# Patient Record
Sex: Male | Born: 1950 | Race: White | Hispanic: No | Marital: Married | Smoking: Never smoker
Health system: Southern US, Community
[De-identification: ages and names within clinical notes are randomized; demographics above are authoritative.]

## PROBLEM LIST (undated history)

## (undated) DIAGNOSIS — I2510 Atherosclerotic heart disease of native coronary artery without angina pectoris: Secondary | ICD-10-CM

## (undated) DIAGNOSIS — T7840XA Allergy, unspecified, initial encounter: Secondary | ICD-10-CM

## (undated) DIAGNOSIS — R011 Cardiac murmur, unspecified: Secondary | ICD-10-CM

## (undated) DIAGNOSIS — I214 Non-ST elevation (NSTEMI) myocardial infarction: Secondary | ICD-10-CM

## (undated) DIAGNOSIS — Z789 Other specified health status: Secondary | ICD-10-CM

## (undated) DIAGNOSIS — I10 Essential (primary) hypertension: Secondary | ICD-10-CM

## (undated) DIAGNOSIS — E785 Hyperlipidemia, unspecified: Secondary | ICD-10-CM

## (undated) HISTORY — DX: Non-ST elevation (NSTEMI) myocardial infarction: I21.4

## (undated) HISTORY — DX: Allergy, unspecified, initial encounter: T78.40XA

## (undated) HISTORY — DX: Atherosclerotic heart disease of native coronary artery without angina pectoris: I25.10

## (undated) HISTORY — PX: CHOLECYSTECTOMY: SHX55

## (undated) HISTORY — DX: Cardiac murmur, unspecified: R01.1

## (undated) HISTORY — DX: Essential (primary) hypertension: I10

## (undated) HISTORY — DX: Hyperlipidemia, unspecified: E78.5

---

## 1993-01-29 HISTORY — PX: APPENDECTOMY: SHX54

## 2001-07-27 ENCOUNTER — Encounter: Payer: Self-pay | Admitting: Emergency Medicine

## 2001-07-27 ENCOUNTER — Emergency Department (HOSPITAL_COMMUNITY): Admission: EM | Admit: 2001-07-27 | Discharge: 2001-07-27 | Payer: Self-pay | Admitting: Emergency Medicine

## 2002-01-07 ENCOUNTER — Encounter: Payer: Self-pay | Admitting: Family Medicine

## 2002-01-07 ENCOUNTER — Ambulatory Visit (HOSPITAL_COMMUNITY): Admission: RE | Admit: 2002-01-07 | Discharge: 2002-01-07 | Payer: Self-pay | Admitting: Family Medicine

## 2002-02-13 ENCOUNTER — Ambulatory Visit (HOSPITAL_COMMUNITY): Admission: RE | Admit: 2002-02-13 | Discharge: 2002-02-13 | Payer: Self-pay | Admitting: Internal Medicine

## 2003-01-30 DIAGNOSIS — I214 Non-ST elevation (NSTEMI) myocardial infarction: Secondary | ICD-10-CM

## 2003-01-30 HISTORY — DX: Non-ST elevation (NSTEMI) myocardial infarction: I21.4

## 2003-07-09 ENCOUNTER — Inpatient Hospital Stay (HOSPITAL_COMMUNITY): Admission: EM | Admit: 2003-07-09 | Discharge: 2003-07-13 | Payer: Self-pay | Admitting: *Deleted

## 2003-07-09 ENCOUNTER — Encounter: Payer: Self-pay | Admitting: Emergency Medicine

## 2003-07-09 HISTORY — PX: CORONARY ANGIOPLASTY WITH STENT PLACEMENT: SHX49

## 2003-07-12 ENCOUNTER — Encounter (INDEPENDENT_AMBULATORY_CARE_PROVIDER_SITE_OTHER): Payer: Self-pay | Admitting: *Deleted

## 2003-07-23 ENCOUNTER — Observation Stay (HOSPITAL_COMMUNITY): Admission: EM | Admit: 2003-07-23 | Discharge: 2003-07-23 | Payer: Self-pay | Admitting: Cardiology

## 2003-07-23 ENCOUNTER — Encounter: Payer: Self-pay | Admitting: Emergency Medicine

## 2008-01-10 ENCOUNTER — Emergency Department (HOSPITAL_COMMUNITY): Admission: EM | Admit: 2008-01-10 | Discharge: 2008-01-11 | Payer: Self-pay | Admitting: Emergency Medicine

## 2008-03-15 HISTORY — PX: NM MYOCAR PERF WALL MOTION: HXRAD629

## 2008-04-22 ENCOUNTER — Ambulatory Visit (HOSPITAL_COMMUNITY): Admission: RE | Admit: 2008-04-22 | Discharge: 2008-04-22 | Payer: Self-pay | Admitting: General Surgery

## 2008-04-22 ENCOUNTER — Encounter (INDEPENDENT_AMBULATORY_CARE_PROVIDER_SITE_OTHER): Payer: Self-pay | Admitting: General Surgery

## 2009-08-12 IMAGING — CT CT ABDOMEN W/ CM
1 of 3 series · 14 of 32 positions shown, 19 images · IV contrast (Omnipaque 300)
Comparison: None

CT ABDOMEN

CLINICAL DATA: Abdominal pain, nausea, vomiting, past history
appendectomy

CT ABDOMEN AND PELVIS WITH CONTRAST
TECHNIQUE: Multidetector CT imaging of the abdomen and pelvis was
performed using the standard protocol following bolus
administration of intravenous contrast. Sagittal and coronal MPR
images reconstructed from axial data set.
Contrast: Dilute oral contrast and 100 ml Wmnipaque-722

[Series 2: abd_pel 5.0 b40f · axial · 0.82mm/px · z∈[-660,-230]mm · 14 of 98 slices shown, 19 images]
[im 6/98  soft-tissue]
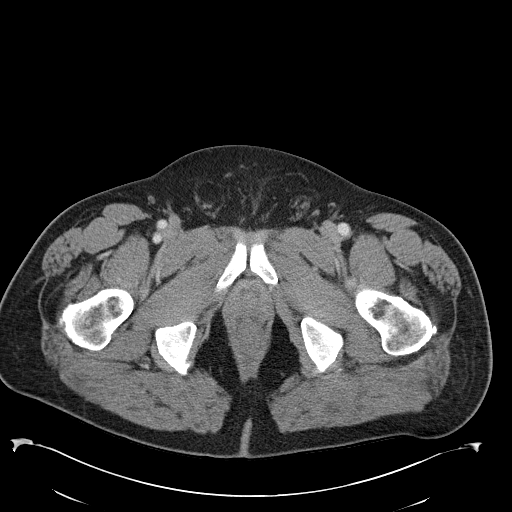
[im 6/98  bone]
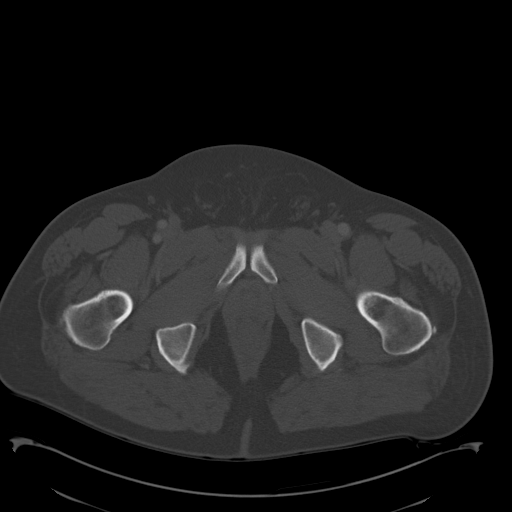
[im 12/98  soft-tissue]
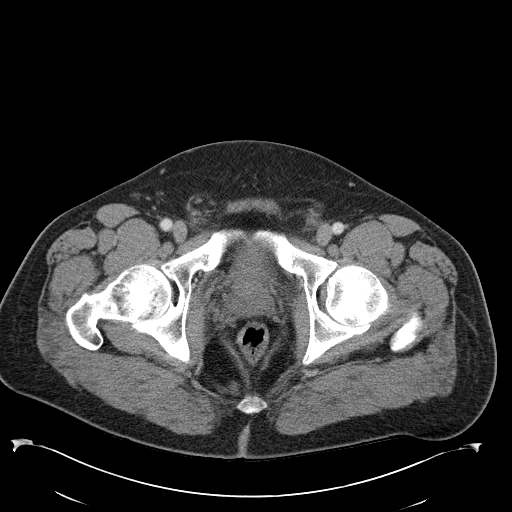
[im 23/98  soft-tissue]
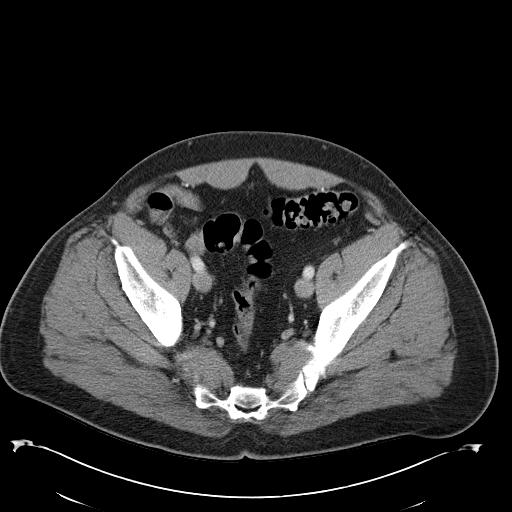
[im 29/98  soft-tissue]
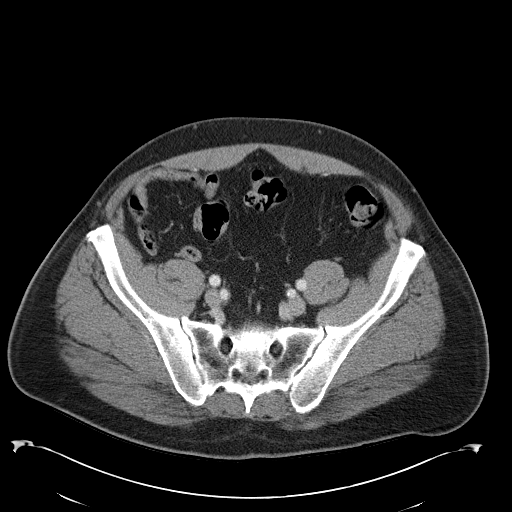
[im 35/98  soft-tissue]
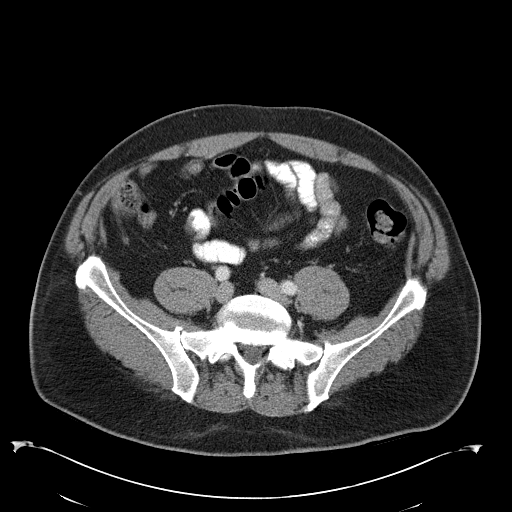
[im 40/98  soft-tissue]
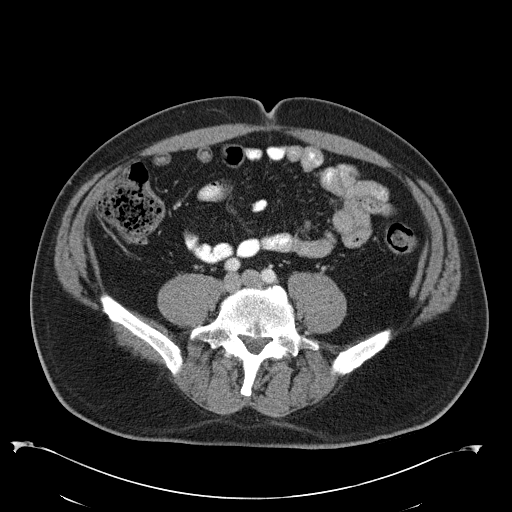
[im 52/98  soft-tissue]
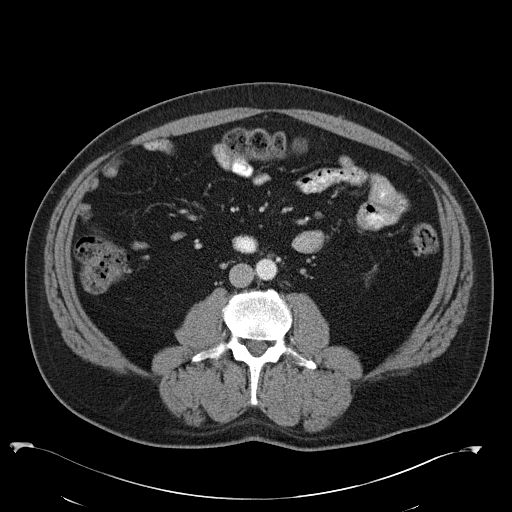
[im 58/98  soft-tissue]
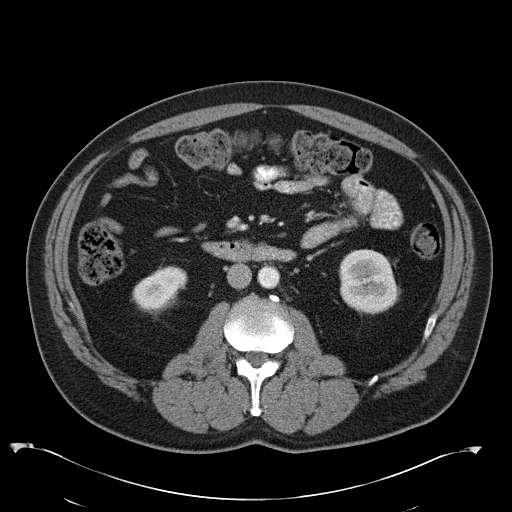
[im 63/98  soft-tissue]
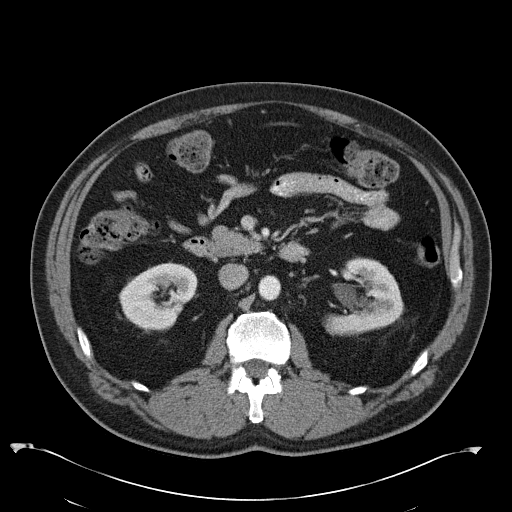
[im 63/98  bone]
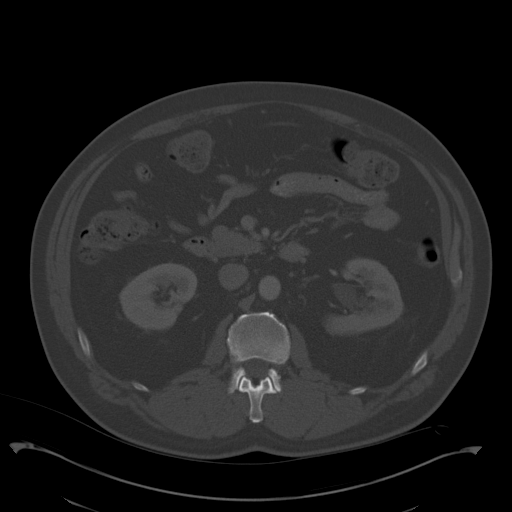
[im 69/98  soft-tissue]
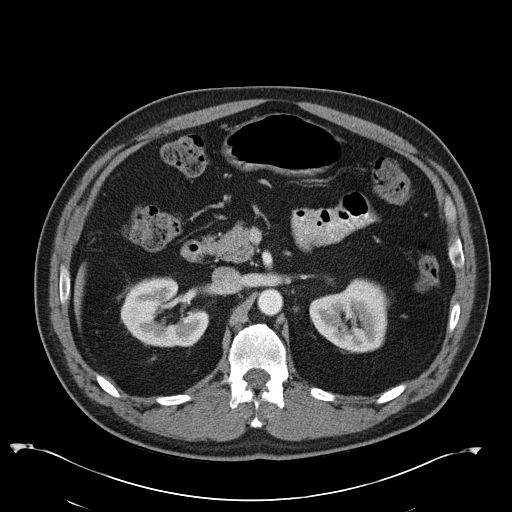
[im 75/98  soft-tissue]
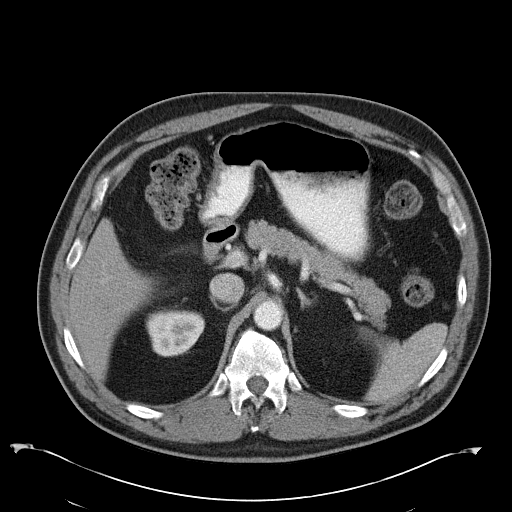
[im 75/98  lung]
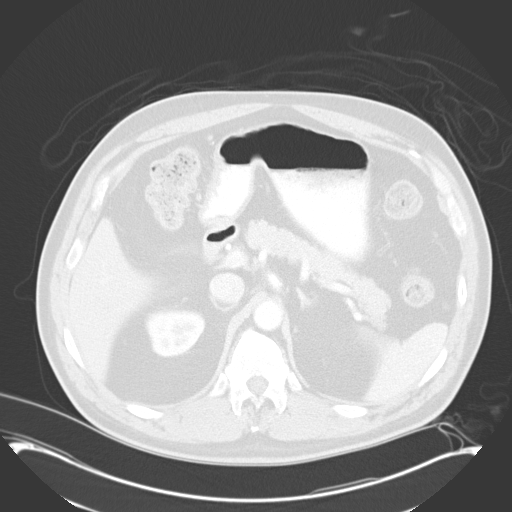
[im 80/98  lung]
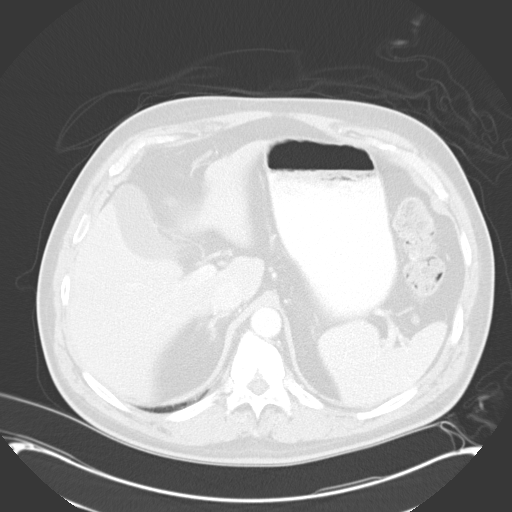
[im 86/98  soft-tissue]
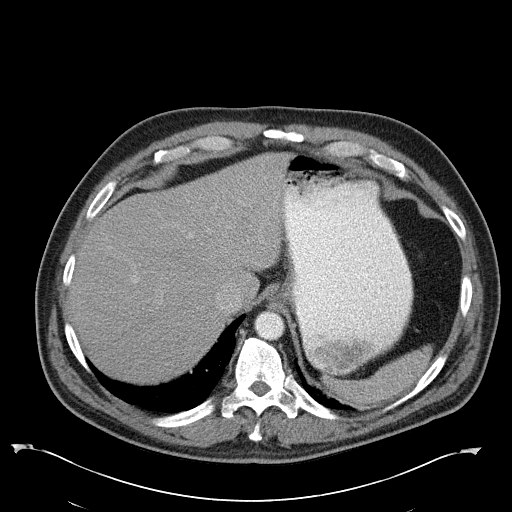
[im 86/98  lung]
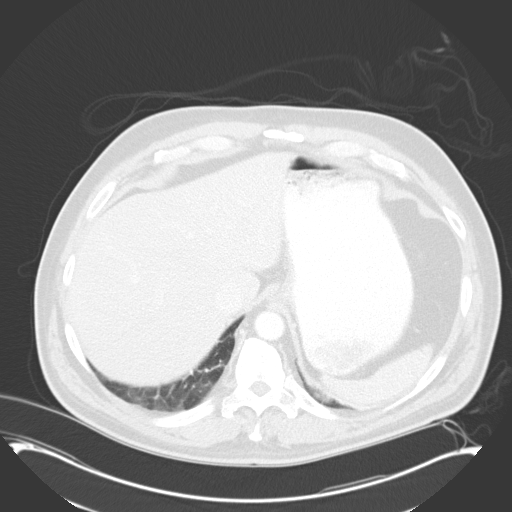
[im 92/98  soft-tissue]
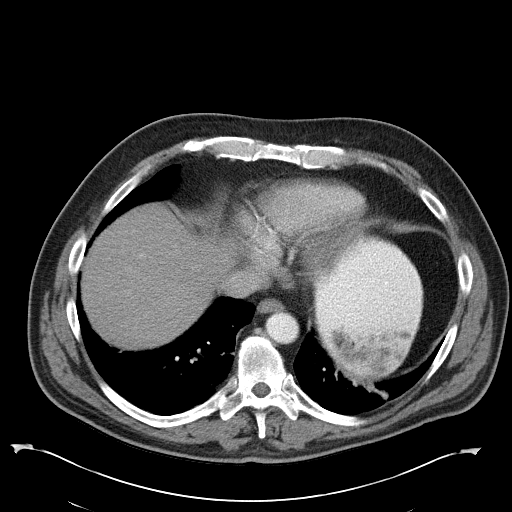
[im 92/98  lung]
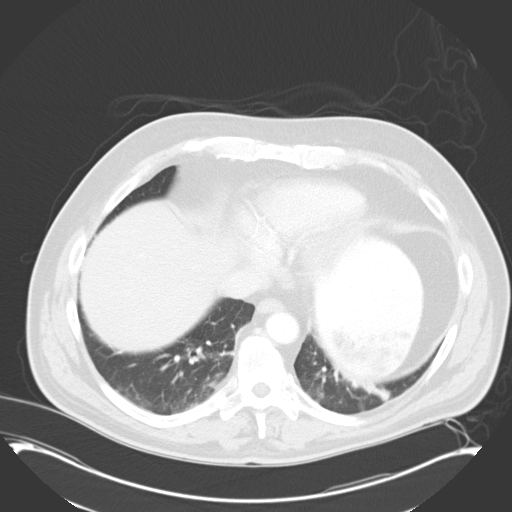

[14 of 32 positions shown; findings below may reference images not displayed]

FINDINGS: Subsegmental atelectasis bilateral lower lobes, greater on left.
Calcified gallstones at neck of gallbladder without gross
gallbladder wall thickening.
 However, on sagittal and coronal images, question minimal
pericholecystic infiltrative changes, cannot exclude early acute
cholecystitis.
Small peripelvic cyst left kidney.
Remainder of liver, spleen, pancreas, kidneys, and adrenal glands
normal.
Scattered colonic diverticulosis without evidence of diverticulitis
in upper abdomen.
Stomach and small bowel loops in upper abdomen normal.
No mass, adenopathy, or free fluid.
IMPRESSION: Scattered colonic diverticulosis.
Cholelithiasis, with questionable minimal pericholecystic
infiltrative changes, cannot exclude early acute cholecystitis.

CT PELVIS
FINDINGS: Diffuse simple/uncomplicated sigmoid diverticula noted.
Large and small bowel loops in pelvis otherwise normal.
No features of acute diverticulitis identified.
Minimal enlargement of right inguinal canal, question inguinal
hernia containing fat.
No pelvic mass, adenopathy, free fluid, or inflammatory process.
Bladder, ureters, and prostate gland grossly normal.
Spur formation vertebral endplates thoracolumbar spine.
IMPRESSION: No acute intrapelvic abnormalities.
Minimal sigmoid diverticulosis.

## 2010-05-11 LAB — BASIC METABOLIC PANEL
BUN: 10 mg/dL (ref 6–23)
CO2: 29 mEq/L (ref 19–32)
Chloride: 103 mEq/L (ref 96–112)
Creatinine, Ser: 0.92 mg/dL (ref 0.4–1.5)
Glucose, Bld: 112 mg/dL — ABNORMAL HIGH (ref 70–99)

## 2010-05-11 LAB — HEPATIC FUNCTION PANEL
Indirect Bilirubin: 0.4 mg/dL (ref 0.3–0.9)
Total Protein: 6.2 g/dL (ref 6.0–8.3)

## 2010-05-11 LAB — CBC
MCHC: 35 g/dL (ref 30.0–36.0)
MCV: 88.9 fL (ref 78.0–100.0)
Platelets: 240 10*3/uL (ref 150–400)
RDW: 12.4 % (ref 11.5–15.5)

## 2010-06-13 NOTE — H&P (Signed)
NAME:  Christopher Obrien, PISCITELLO                  ACCOUNT NO.:  [id number]   MEDICAL RECORD NO.:  [id number]          PATIENT TYPE:  AMB   LOCATION:  DAY                           FACILITY:  [facility]   PHYSICIAN:  Dalia Heading, M.D.  DATE OF BIRTH:  16-Jul-1950   DATE OF ADMISSION:  DATE OF DISCHARGE:  LH                              HISTORY & PHYSICAL   CHIEF COMPLAINT:  Cholecystitis, cholelithiasis.   HISTORY OF PRESENT ILLNESS:  The patient is a 60 year old white male who  is referred for evaluation and treatment for biliary colic secondary to  cholelithiasis.  He has been having intermittent right upper quadrant  abdominal pain with radiation to the flank, nausea and indigestion for  the past few months.  This is made worse with fatty fluids.  No fever,  chills, or jaundice have been noted.   PAST MEDICAL HISTORY:  1. Coronary artery disease.  2. Hypertension.   PAST SURGICAL HISTORY:  Appendectomy in the remote past.   MEDICATIONS:  1. Lisinopril 5 mg p.o. daily.  2. Toprol 50 mg p.o. daily.  3. Simvastatin 20 mg p.o. daily.  4. Plavix which he is holding.  5. Aspirin which he is holding.   ALLERGIES:  PENICILLIN.   REVIEW OF SYSTEMS:  The patient had a recent stress test in February  2010, which was unremarkable.  The patient denies drinking or smoking.   PHYSICAL EXAMINATION:  GENERAL:  The patient is a well-developed, well-  nourished white male in no acute distress.  HEENT:  No scleral icterus.  LUNGS:  Clear to auscultation with equal breath sounds bilaterally.  HEART:  Regular rate and rhythm without S3, S4, or murmurs.  ABDOMEN:  Soft and nondistended.  He is tender in the right upper  quadrant to palpation.  No hepatosplenomegaly, masses, or hernias are  identified.   CT scan of the abdomen and pelvis reveals cholelithiasis with a normal  common bile duct.   IMPRESSION:  1. Cholecystitis.  2. Cholelithiasis.   PLAN:  The patient is scheduled for laparoscopic  cholecystectomy on  April 22, 2008.  The risks and benefits of the procedure including  bleeding, infection, hepatobiliary injury, and the possibility of an  open procedure were fully explained to the patient, gave informed  consent.      Dalia Heading, M.D.  Electronically Signed     MAJ/MEDQ  D:  04/15/2008  T:  04/16/2008  Job:  [id number]   cc:   Corrie Mckusick, M.D.  Fax: [phone]

## 2010-06-13 NOTE — Op Note (Signed)
NAME:  Christopher Obrien, Christopher Obrien                  ACCOUNT NO.:  [id number]   MEDICAL RECORD NO.:  [id number]          PATIENT TYPE:  AMB   LOCATION:  DAY                           FACILITY:  [facility]   PHYSICIAN:  Dalia Heading, M.D.  DATE OF BIRTH:  08/26/1950   DATE OF PROCEDURE:  04/22/2008  DATE OF DISCHARGE:                               OPERATIVE REPORT   PREOPERATIVE DIAGNOSES:  Cholecystitis, cholelithiasis.   POSTOPERATIVE DIAGNOSES:  Cholecystitis, cholelithiasis.   PROCEDURE:  Laparoscopic cholecystectomy.   SURGEON:  Dalia Heading, M.D.   ANESTHESIA:  General endotracheal.   INDICATIONS:  The patient is a 60 year old white male who presents with  a biliary colic secondary to cholelithiasis.  The risks and benefits of  the procedure including bleeding, infection, hepatobiliary injury, and  the possibility of an open procedure were fully explained to the  patient, gave informed consent.   PROCEDURE NOTE:  The patient was placed in supine position.  After  induction of general endotracheal anesthesia, the abdomen was prepped  and draped using the usual sterile technique with Betadine.  Surgical  site confirmation was performed.   A supraumbilical incision was made down to the fascia.  A Veress needle  was introduced into the abdominal cavity and confirmation of placement  was done using the saline drop test.  The abdomen was then insufflated  to 16 mmHg pressure.  An 11-mm trocar was introduced into the abdominal  cavity under direct visualization without difficulty.  The patient was  placed in reversed Trendelenburg position.  An additional 11-mm trocar  was placed in the epigastric region.  The 5-mm trocars were placed in  the right upper quadrant, right flank regions.  The liver was inspected  and noted to be within normal limits.  The gallbladder was retracted  superiorly and laterally.  The dissection was begun around the  infundibulum of the gallbladder.  The cystic duct was  first identified.  The juncture to the infundibulum was fully identified.  Endoclips were  placed proximally and distally on the cystic duct, and the cystic duct  was divided.  This likewise done in the cystic artery.  The gallbladder  was then freed away from the gallbladder fossa using Bovie  electrocautery.  There was some spillage of bile.  The gallbladder was  delivered through the epigastric trocar site using EndoCatch bag.  The  gallbladder fossa was inspected and no abnormal bleeding or bile leakage  was noted.  Surgicel was placed in the gallbladder fossa.  All fluid and  air were then evacuated from the abdominal cavity prior to the removal  of the trocars.   All wounds were irrigated with normal saline.  The supraumbilical fascia  as well as epigastric fascia reapproximated using an 0 Vicryl  interrupted suture.  All skin incisions were closed using staples.  Betadine ointment and dry sterile dressings were applied.   All tape and needle counts were correct at the end of the procedure.  The patient was extubated in the operating room, went back to recovery  room, awake and in  stable condition.   COMPLICATIONS:  None.   SPECIMEN:  Gallbladder.   BLOOD LOSS:  Minimal.      Dalia Heading, M.D.  Electronically Signed     MAJ/MEDQ  D:  04/22/2008  T:  04/22/2008  Job:  [id number]   cc:   Corrie Mckusick, M.D.  Fax: [phone]

## 2010-06-16 NOTE — Discharge Summary (Signed)
NAME:  Christopher Obrien, Christopher Obrien                            ACCOUNT NO.:  [id number]   MEDICAL RECORD NO.:  [id number]                   PATIENT TYPE:  INP   LOCATION:  6523                                 FACILITY:  [facility]   PHYSICIAN:  Dani Gobble, MD                    DATE OF BIRTH:  October 02, 1950   DATE OF ADMISSION:  07/09/2003  DATE OF DISCHARGE:  07/13/2003                                 DISCHARGE SUMMARY   ADMISSION DIAGNOSES:  1. Acute myocardial infarction.  2. No significant past medical history including no prior cardiac history     and no known cardiac risk factors.   DISCHARGE DIAGNOSES:  1. Acute myocardial infarction.  2. No significant past medical history including no prior cardiac history     and no known cardiac risk factors.  3. Status post cardiac catheterization on July 12, 2003, by Dr. Gaspar Garbe B.     Little.  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  He received a PTCA stent using mini-Vision stents to     the area with good results, from 100% occlusion to less than 10% residual     stenosis.  See his dictated report for other details and findings.  He     used IV heparin and IV Integrilin and used Plavix 300 mg p.o. in the     case.  4. Hyperlipidemia.   HISTORY OF PRESENT ILLNESS:  Christopher Obrien is a 60 year old white male with no  significant past medical history and no history of coronary artery disease.  He presented to [facility] on July 09, 2003, reporting that he did some  heavy lifting earlier in the day to take the trash to the dump.  At that  time, he expressed some vague abdominal discomfort with radiation to the  bilateral chest for about five minutes, and then it resolved.  It then  recurred with radiation to the left arm and the left posterior neck and jaw.  There was a waxing and waning pattern for about 75 minutes, and then he went  to the [facility] ER.  He obtained relief after four aspirin and three  sublingual  nitroglycerin.  He was placed on IV heparin, IV nitroglycerin and  was on these medications when he was transferred to [facility].  At the time  of our evaluation, his abdominal discomfort and chest pain have been  relieved.  He did still have a odd sensation in the left neck, but no real  pain.   PAST MEDICAL HISTORY:  He reported he had prior known coronary artery  disease, and he had no prior similar episodes.  He had no known  hypertension, diabetes, tobacco use or hyperlipidemia.  He has had no recent  palpitations, shortness of breath, dizziness, presyncope, edema, PND or  orthopnea.   FAMILY HISTORY:  He was adopted and did not known his family history.   PHYSICAL EXAMINATION:  VITAL SIGNS:  On exam at this time, blood pressure  106/65, heart rate 74.  He did have a 1-2/6 systolic murmur on cardiac exam.  No other significant abnormalities on exam.   At this point, EKG reviewed from [facility] had shown normal sinus rhythm,  suggestion of inferior myocardial infarction, age indeterminate, with 1 mm  of ST elevation in 3 and aVF and late transition.  Repeat EKG showed ST  elevation, resolved, and more subtle ST changes at this time.  Otherwise, no  other change as compared to prior tracing.   LABORATORY DATA:  Lab at this time showed cardiac markers, myoglobin's were  81.6, 265, 353.  CK-MB was less than 1.  Then it was 3.6 and 4.7.  Troponin  was less than 0.05, 0.07, 0.07.   At this point, he was seen and evaluated by Dr. Sherral Hammers.  She  planned for admission to the telemetry unit to check serial enzymes and  follow up probable MI.  She will continue IV heparin and IV nitroglycerin.  Would consider 2B3A in anticipation of possible catheterization and PCI.  At  that point, she was waiting for the followup EKG.  The EKG then was done and  showed suggested inferior MI, age indeterminate, but no further suggestion  of ST elevation.  However, the patient did report some  recurrent chest  tightness at 2/10, so she went ahead and added 2B3A.  She continued aspirin,  beta-blocker.  No ACE inhibitor was given at that time because of low blood  pressure in anticipation of contrast dye.  We would add empiric statin and  check fasting lipid profile.  He would need a cardiac catheterization but  the timing would depend on clinical course.   HOSPITAL COURSE:  On the morning of July 10, 2003, Christopher Obrien still had some  tightness in the left upper chest.  He was hemodynamically stable with blood  pressure 113/72, heart rate 56.  Labs that morning showed CK of 399 and MB  84, troponin of 1.82.  This was the only set of cardiac enzymes after the  cardiac markers that were noted above.  At this point, he was continued on  heparin, nitroglycerin, 2B3A and other medications as above.  At this point,  he was evaluated by Dr. Clarene Duke, and we planned to proceed with  catheterization on Monday morning.  We would do it sooner if he had further  more severe chest pain, or EKG changes.   On July 11, 2003, he was feeling better.  At this point, we had a full set  of cardiac enzymes, and they had peaked with a CK of 802, MB 182.1 and  troponin of 15.44.  However, at this point, the CK-MB's were trending  downward after that peak.  Again, he was seen by Dr. Clarene Duke once again.  He  noticed that he enzymes had peaked.  His EKG still was essentially normal  with no ST-T changes.  At this point, he was pain-free.  He planned to  proceed with catheterization on the following morning, Monday morning.   On July 12, 2003, he underwent cardiac catheterization by Dr. Clarene Duke.  See  his dictated report for all findings.  The notable finding was 100%  occlusion in the proximal vessel of the OM1.  Dr. Clarene Duke then proceeded with  PTCA stent using mini-Vision stent to the area.  It was 100% occluded at the beginning of the case, and was less than 10% occluded post intervention.  During the case,  he used Integrilin, IV heparin, and Plavix 300 mg p.o.  The  patient tolerated the procedure well without complications.  See the  dictated report for further findings.   DISCHARGE PHYSICAL EXAMINATION:  On the morning of July 13, 2003, Mr. Grumbine  is doing well.  At this point, his heart rate is 80's, blood pressure 120-  130/60.  He is having no chest pain or shortness of breath.  He feels good.  The catheterization site has no bruising, bleeding or hematoma.  The lungs  are clear.  Heart is a regular rhythm with no notable murmur at this time.  Creatinine is stable at 1.1, and enzymes continued to peak downward.  At  this point, he was seen an evaluated by Dr. Caprice Kluver who deems him stable  for discharge home.  His lipid profile did come back during the  hospitalization with an LDL of 107.  Medications include Zocor 20 mg,  aspirin, Plavix, ACE inhibitor, and beta-blocker therapy.   HOSPITAL CONSULTATIONS:  None.   HOSPITAL PROCEDURES:  Cardiac catheterization performed on July 12, 2003, by  Dr. Caprice Kluver.  See the dictated report for details.  Notable findings  included a 100% occlusion of a branch of the OM1 vessel.  He proceeded with  PTCA stent using a Mini-Vision stent.  At the beginning of the case, there  was 100% occlusion.  Post-intervention, it was less than 10% residual  stenosis.  The patient tolerated the procedure well without complications.  During the procedure, he used Integrilin, IV heparin and Plavix 300 mg p.o.  See his dictated report for other findings.  As well, the patient did have  normal LV function, and had no complications.   LABORATORY DATA:  TSH normal at 0.543.  CRP is 0.8.  Lipid profile shows  total cholesterol 159, triglycerides  86, HDL 38, LDL 104.  Cardiac enzymes  showed CK's of 399, 802, 490, 113.  CK-MB 84.0, 182,1, 80.1, 11.3.  Troponin  is 1.82, 14.56, 15.44, 12.56.  Hemoglobin A1c is 5.0.  On admission, sodium  138, potassium 3.8, glucose  120, BUN 7, creatinine 1.0.  These are all  stable at the time of discharge home including a creatinine of 1.1.  Homocystine is 9.73.  On admission, white count 13.1, hemoglobin 13.7,  hematocrit 39.1, platelets 260,000.  These are all stable at discharge  including hemoglobin of 14.1, hematocrit 40.7, and platelets 272,000 post  heparin.   EKG on July 09, 2003, showed sinus bradycardia at 55 beats per minute.  There is some nonspecific indeterminate Q changes in lead aVF, no  significant ST-T change.  Same is true for EKG on July 10, 2003.   A 2-D echocardiogram on July 16, 2003, shows a technically difficult study.  LV size appears normal.  Overall, EF appears preserved.  No significant  valvular abnormality noted.   DISCHARGE MEDICATIONS:  1. Plavix 75 mg once a day.  2. Aspirin 81 mg once a day.  3. Zocor 20 mg each night.  4. Zestril 5 mg once a day.  5. __________50 mg once a day. 6. Nitroglycerin 0.4 mg sublingual as directed.   DISCHARGE INSTRUCTIONS:  1. No strenuous activity until you see Dr. Dani Gobble.  2. No lifting greater than five pounds, driving or sexual activities for     three days.  3. Low cholesterol diet.  4. May gently wash your groin site.  5. Call [phone] for any bleeding increase or pain at the groin site.  6. Follow up with Dr. Domingo Sep in the [facility] office June 28, at 11     o'clock a.m.      Mary B. Easley, P.A.-C.                   Dani Gobble, MD    MBE/MEDQ  D:  08/05/2003  T:  08/05/2003  Job:  [id number]

## 2010-06-16 NOTE — Cardiovascular Report (Signed)
NAME:  Christopher Obrien, Christopher Obrien                            ACCOUNT NO.:  [id number]   MEDICAL RECORD NO.:  [id number]                   PATIENT TYPE:  INP   LOCATION:  6523                                 FACILITY:  [facility]   PHYSICIAN:  Thereasa Solo. Little, M.D.              DATE OF BIRTH:  Nov 21, 1950   DATE OF PROCEDURE:  07/12/2003  DATE OF DISCHARGE:                              CARDIAC CATHETERIZATION   PROCEDURE:  Cardiac catheterization.   CARDIOLOGIST:  Thereasa Solo. Little, M.D.   INDICATIONS:  Christopher Obrien is a 60 year old male who was admitted on July 09, 2003, with chest pain.  He had a normal EKG and has maintained a normal EKG.  He had low positive cardiac enzymes the morning after his admission but  subsequent enzymes 24 hours later peaked with a troponin of greater than 15  and a CK-MB of 182.  Despite these marked elevations, he was pain-free and  had a normal electrocardiogram.   He is brought to the cardiac catheterization lab for evaluation of what  appears to be non-Q wave myocardial infarction.   DESCRIPTION OF PROCEDURE:  After obtaining informed consent, the patient was  prepped and draped in the usual sterile fashion exposing the right groin.  Following local anesthetic with 1% Xylocaine, the Seldinger technique was  employed, and a 6 [place] sheath introducer sheath was placed in the right  femoral artery.  Right and left coronary arteriography and ventriculography  in the RAO and LAO projection was performed.  Following this, percutaneous  coronary intervention to the OM system was performed.   COMPLICATIONS:  None.   EQUIPMENT:  Diagnostic 6 French Judkins configuration catheters.   RESULTS:  HEMODYNAMIC MONITORING:  Central aortic pressure 118/74.  Left  ventricular pressure 118/14 with no significant valve gradient at the time  of pullback.  VENTRICULOGRAPHY:  Ventriculography in both the RAO and LAO projections  revealed normal LV systolic function.  In particular,  there were no wall  motion abnormalities involving the lateral wall.  His ejection fraction was  in excess of 60%.  The left ventricular end-diastolic pressure was 24.  CORONARY ARTERIOGRAM:  1. Left main normal.  2. Left anterior descending:  The LAD was a large vessel that extended down     to the apex of the heart.  After the takeoff of the first diagonal, the     vessel became about 2.5 mm from about 3.5 mm.  There was a 30% narrowing     in the LAD after the takeoff of the diagonal.  The first diagonal was a     very large vessel greater than 3.5 mm and was free of disease.  The     proximal LAD had no significant abnormalities.  3. Circumflex:  The circumflex was a large vessel giving off two large OM     vessels.  They were about 3.5 mm  in diameter.  OM-I bifurcated and gave     off a branch that was totally occluded.  You can see just enough of the     vessel and some late filling of the distal vessel via left-to-left     collaterals.  There was contrast staining of the proximal portion raising     a concern of a small thrombus.  The remainder of the OM-I was free of     disease.  The OM-II was a large vessel that was free of disease and the     ongoing circumflex after the OM-2 was small but supplied the posterior     lateral wall.  4. Right coronary artery:  The right coronary artery was a large vessel     giving rise to a PDA.   CONCLUSION:  1. Normal left ventricular systolic function in both the RAO and LAO     projections.  2. Total occlusion of a branch off of the first obtuse marginal vessel.  3. Because of the collateral filling of the distal portion of this vessel,     arrangements were made for percutaneous intervention.  A 6 [place]     guide catheter was used and a short luge wire.  The wire was placed down     through the total obstruction into what appeared to be the distal portion     of the vessel.  The 2.0 x 9 Maverick balloon was made ready and placed in      the proximal portion of the vessel.  Care was taken so that the inflation     never involved the ostium of this vessel that would put the large OM at     jeopardy.  Three inflations were performed ranging from 7 atmospheres to     8 atmospheres for around 55 seconds.  Despite this, there was still a 60%     area or narrowing but the distal vessel appeared to be quite large in     length and relatively large in diameter.   A 2.0 x 12 Minivision stent was then placed just past the ostium of this  vessel and extending well past an area that was initially totally occluded  and now 60% narrowed.  It was initially deployed at 7 atmospheres for 70  seconds with a final inflation being 9 atmospheres for 60 seconds.  Following intervention, there appeared to be less than 10% narrowing.  The  distal vessel did not show any evidence of dissection or thrombus formation.  The patient was given a total of 6200 units of IV heparin.  He has been on  Integrilin since Friday, and this will be continued for an additional 12  hours.  He was also given 300 mg of oral Plavix, and he should be ready for  discharge in the morning.                                               Thereasa Solo. Little, M.D.    ABL/MEDQ  D:  07/12/2003  T:  07/13/2003  Job:  [id number]   cc:   Catheterization Lab   Patrica Duel, M.D.  [address], [address]  [place]  [place] [address]  Fax: [phone]   Dani Gobble, MD  Fax: [phone]

## 2010-06-16 NOTE — Cardiovascular Report (Signed)
NAME:  Christopher Obrien, Christopher Obrien                            ACCOUNT NO.:  [id number]   MEDICAL RECORD NO.:  [id number]                   PATIENT TYPE:  INP   LOCATION:  4741                                 FACILITY:  [facility]   PHYSICIAN:  Nicki Guadalajara, M.D.                  DATE OF BIRTH:  [birth year]   DATE OF PROCEDURE:  07/23/2003  DATE OF DISCHARGE:  07/23/2003                              CARDIAC CATHETERIZATION   PROCEDURE:  Cardiac catheterization.   CARDIOLOGIST:  Nicki Guadalajara, M.D.   INDICATIONS:  Christopher Obrien is a 60 year old gentleman who suffered a non-Q  myocardial infarction on July 09, 2003, and underwent PTCA stenting of an  occluded inferior branch of an OM-1 vessel.  This was done by Dr. Clarene Duke and  he had a 2-0 x 12 mm Minivision stent inserted.  He had done well.  This  morning, he developed recurrent episodes of left jaw discomfort.  He was  concerned perhaps that this may be similar to the previous jaw pain with  mild associated chest pain which he experienced at the time of his MI.  He  took nit.  He ultimately presented to [facility] Emergency Room where he was  treated with IV heparin.  He was transferred to [facility] for more  definitive evaluation.  With the non drug-eluting stent inserted and small  vessel size, the decision was made to perform definitive repeat cardiac  catheterization to make certain there was no suggestion of potential early  restenosis or additional abnormalities.   DESCRIPTION OF PROCEDURE:  After premedication with Valium 5 mg  intravenously, the patient was prepped and draped in the usual fashion. His  right femoral artery was punctured anteriorly, and a 5 French sheath was  inserted.  Diagnostic catheterization was done with Judkins 4 left and right  coronary catheters.  A 5 French pigtail catheter was used for biplane cine  left ventriculography.  The patient tolerated the procedure well.  Hemostasis was obtained by direct manual  pressure.   HEMODYNAMIC DATA:  Central aortic pressure was 116/70.  Left ventricular  pressure 116/11.   ANGIOGRAPHIC DATA:  The left main coronary artery was angiographically  normal and bifurcated into an LAD and left circumflex system.   The LAD had mild luminal irregularity of 10-20% in its proximal segment  before the first diagonal septal perforating artery.  The remainder of the  LAD was free of significant disease.   The circumflex vessel was a moderate size vessel.  The first OM vessel was a  small caliber.  The major OM vessel was immediately bifurcated.  A stent was  seen in the proximal portion of this marginal inferior segment.  The stent  was widely patent.  There was no evidence for in-stent restenosis.  There  was minimal 10-20% narrowing proximal to the stented segment just after  arising from the  main portion of the OM vessel.  The AV groove, circumflex  and its branches were angiographically normal.   The right coronary artery gave rise to a PDA.  There was mild luminal  irregularity of 20% in the proximal to mid segment.   Biplane cine left ventriculography revealed preserved global contractility  with ejection fraction of least 55-60%.  There was a very small focal zone  of mild mid inferior hypocontractility and mid posterior lateral  hypocontractility seen on the RAO and LAO images.   IMPRESSION:  1. Normal global left ventricular contractility with minimal residual mid     diaphragmatic and low posterior lateral hypocontractility.  2. No evidence of any restenosis at the previously placed stent in the     obtuse marginal vessel of the circumflex coronary artery with mild 10%     narrowing proximal to the stented segment.  3. Luminal irregularities of the proximal left anterior descending.  4. Luminal irregularity of 10-20% of the right coronary artery.   RECOMMENDATIONS:  Medical therapy.                                               Nicki Guadalajara,  M.D.    TK/MEDQ  D:  07/23/2003  T:  07/24/2003  Job:  [id number]   cc:   Nicki Guadalajara, M.D.  [phone] [address], [address]  [place], [place] [address]  Fax: [phone]   Patrica Duel, M.D.  [address], [address]  [place]  [place] [address]  Fax: [phone]   Dani Gobble, MD  Fax: [phone]

## 2010-06-16 NOTE — Procedures (Signed)
[facility]  Patient:    Christopher Obrien, Christopher Obrien Visit Number: [id number] MRN: [id number]          Service Type: EMS Location: ED Attending Physician:  Hilario Quarry Dictated by:   Kari Baars, M.D. Proc. Date: 07/27/01 Admit Date:  07/27/2001 Discharge Date: 07/27/2001                            EKG Interpretations  The rhythm is sinus rhythm with a rate in the 70s.  Normal electrocardiogram. Dictated by:   Kari Baars, M.D. Attending Physician:  Hilario Quarry DD:  07/27/01 TD:  07/28/01 Job: [id number]

## 2010-06-16 NOTE — Op Note (Signed)
   NAME:  Christopher Obrien, Christopher Obrien                            ACCOUNT NO.:  [id number]   MEDICAL RECORD NO.:  [id number]                   PATIENT TYPE:  AMB   LOCATION:  DAY                                  FACILITY:  [facility]   PHYSICIAN:  Lionel December, M.D.                 DATE OF BIRTH:  Aug 11, 1950   DATE OF PROCEDURE:  02/14/2002  DATE OF DISCHARGE:                                 OPERATIVE REPORT   PROCEDURE:  Total colonoscopy.   INDICATIONS:  The patient is a 60 year old Caucasian male who is here for  screening colonoscopy.  He does not have any GI symptoms.  Family history is  unknown.   The procedure was reviewed with the patient and informed consent was  obtained.   PREMEDICATION:  Demerol 50 mg IV, Versed 4 mg IV in divided dose.   INSTRUMENT USED:  Olympus video system.   FINDINGS:  Procedure performed in endoscopy suite.  The patient's vital  signs and O2 saturation were monitored during the procedure and remained  stable.  The patient was placed in the left lateral recumbent position and  rectal examination performed.  No abnormality noted on external or digital  exam.  The scope was placed in the rectum and advanced under vision in the  sigmoid colon and beyond.  Preparation was excellent.  The scope was passed  into the cecum, which was identified by the appendiceal stump and ileocecal  valve.  Pictures were taken for the record.  As the scope was withdrawn,  colonic mucosa was once again carefully examined.  There were two small  diverticula at the transverse colon and a few more small ones at the sigmoid  colon.  There were no polyps or tumor masses.  The rectal mucosa was normal.  The scope was retroflexed to examine the anorectal junction.  Small  hemorrhoids were noted below the dentate line.  The endoscope was  straightened and withdrawn.  The patient tolerated the procedure well.   FINAL DIAGNOSES:  Few small scattered diverticula at sigmoid and transverse  colon and  external hemorrhoids, otherwise normal colonoscopy.    RECOMMENDATIONS:  1. A high-fiber diet.  2. Yearly Hemoccults.  3. He may consider next screening exam in 10 years from now.                                               Lionel December, M.D.    NR/MEDQ  D:  02/13/2002  T:  02/14/2002  Job:  [id number]   cc:   Patrica Duel, M.D.  [address], [address]  [place]  [place] [address]  Fax: [phone]

## 2010-06-16 NOTE — Discharge Summary (Signed)
NAME:  Christopher Obrien, Christopher Obrien                            ACCOUNT NO.:  [id number]   MEDICAL RECORD NO.:  [id number]                   PATIENT TYPE:  INP   LOCATION:  4741                                 FACILITY:  [facility]   PHYSICIAN:  Nicki Guadalajara, M.D.                  DATE OF BIRTH:  1950/09/30   DATE OF ADMISSION:  07/23/2003  DATE OF DISCHARGE:  07/23/2003                                 DISCHARGE SUMMARY   ADMISSION DIAGNOSES:  1. Unstable angina.  2. Coronary artery disease with prior stent to the obtuse marginal July 11, 2003.  3. Hyperlipidemia.   DISCHARGE DIAGNOSES:  1. Unstable angina.  2. Coronary artery disease with prior stent to the obtuse marginal July 11, 2003.  3. Hyperlipidemia.   PROCEDURES:  Cardiac catheterization, June 24, by Nicki Guadalajara, M.D.   BRIEF HISTORY:  The patient is a 60 year old white male with a recent  admission on July 09, 2003, for a subendocardial myocardial infarction with  a stent to a branch of the OM on July 11, 2003.  He did well until 3 a.m.  the day of admission.  He woke up to go to the bathroom and had left face  and jaw and tongue discomfort that would not go away, so he took a  nitroglycerin.  The discomfort resolved over 10 minutes.  The patient  worried, so he came to the ER at [facility] and was transferred to  [facility].  The pain was similar to what he had when he had his  MI.  He was subsequently admitted and seen by Dr. Daphene Jaeger, and it was his  opinion that he should undergo cardiac catheterization.   PAST MEDICAL HISTORY:  Cardiac disease with SEMI July 09, 2003.  He has no  history of hypertension, diabetes, or tobacco use.   ALLERGIES:  PENICILLIN causes a rash.   CURRENT MEDICATIONS:  1. Plavix 75 mg daily.  2. Aspirin 81 mg daily.  3. Zocor 20 mg h.s.  4. Zestril 5 mg daily.  5. Toprol XL 50 mg daily.  6. Sublingual nitroglycerin.   FAMILY HISTORY:  Unknown.  The patient is adopted.   SOCIAL HISTORY:  He is married, three children.  Does not use tobacco or  alcohol, and no drug use.   REVIEW OF SYSTEMS:  Negative except for cardiovascular.   PHYSICAL EXAMINATION:  VITAL SIGNS:  Blood pressure is 129/74, pulse is 81,  respirations 20, temperature is 98.2.  O2 saturation on 2 L is 100%.  GENERAL:  An alert and oriented white male in no acute distress.  HEENT:  Within normal limits.  NECK:  No bruits, no JVD, no thyromegaly.  CARDIAC:  No murmurs, rubs, or gallops.  CHEST:  Lungs clear to auscultation and percussion.  ABDOMEN:  Within normal limits.  LOWER  EXTREMITIES:  No edema, good pedal pulses.  NEUROLOGIC:  No focal changes.   EKG showed a sinus rhythm, rate of 68, with no acute changes.  Hemoglobin  was 13.1, hematocrit was 36, white count 7.4, platelets 429,000.  Electrolytes were normal, BUN is 16, creatinine is 1.1.  LFTs are normal.  CKs and troponins were negative.   HOSPITAL COURSE:  The patient was seen by Dr. Daphene Jaeger.  He was concerned  enough that he recommended cardiac catheterization at that time.  The  patient was taken to the catheterization lab and underwent cardiac  catheterization.  The patient had some diffuse 20% stenosis in the RCA.  He  had some proximal 10-20% stenosis of the LAD.  The stent was patent in the  obtuse marginal, and ejection fraction was thought to be normal.  There was  some hypokinesis present but no significant problems.  After completion of  the study it was Dr. Landry Dyke opinion that there was no restenosis of the  stent site and that he could be discharged home later in the day.  The  patient already has an appointment to see Dr. Domingo Sep in [place] next  week and will keep that appointment.   He is to continue his current medications as listed above and will add  Norvasc 2.5 mg daily.   CONDITION ON DISCHARGE:  Stable.      Eber Hong, P.A.                 Nicki Guadalajara, M.D.    WDJ/MEDQ  D:   07/23/2003  T:  07/23/2003  Job:  [id number]   cc:   Patrica Duel, M.D.  [address]  [place]  [place] [address]  Fax: [phone]   [place] Office

## 2010-11-02 LAB — COMPREHENSIVE METABOLIC PANEL
ALT: 27 U/L (ref 0–53)
AST: 27 U/L (ref 0–37)
Alkaline Phosphatase: 61 U/L (ref 39–117)
CO2: 29 mEq/L (ref 19–32)
GFR calc Af Amer: >60 mL/min (ref >60)
Glucose, Bld: 113 mg/dL — ABNORMAL HIGH (ref 70–99)
Potassium: 3.5 mEq/L (ref 3.5–5.1)
Sodium: 142 mEq/L (ref 135–145)
Total Protein: 6.5 g/dL (ref 6.0–8.3)

## 2010-11-02 LAB — URINALYSIS, ROUTINE W REFLEX MICROSCOPIC
Nitrite: NEGATIVE
Specific Gravity, Urine: >1.03 — ABNORMAL HIGH (ref 1.005–1.030)
Urobilinogen, UA: 0.2 mg/dL (ref 0.0–1.0)
pH: 5.5 (ref 5.0–8.0)

## 2010-11-02 LAB — DIFFERENTIAL
Basophils Relative: 1 % (ref 0–1)
Eosinophils Absolute: 0.4 10*3/uL (ref 0.0–0.7)
Eosinophils Relative: 5 % (ref 0–5)
Lymphs Abs: 2 10*3/uL (ref 0.7–4.0)
Monocytes Absolute: 0.7 10*3/uL (ref 0.1–1.0)
Monocytes Relative: 9 % (ref 3–12)
Neutrophils Relative %: 62 % (ref 43–77)

## 2010-11-02 LAB — CBC
Hemoglobin: 15 g/dL (ref 13.0–17.0)
RBC: 4.84 MIL/uL (ref 4.22–5.81)
RDW: 12.4 % (ref 11.5–15.5)

## 2010-11-02 LAB — POCT CARDIAC MARKERS: Troponin i, poc: <0.05 ng/mL (ref 0.00–0.09)

## 2012-08-25 ENCOUNTER — Other Ambulatory Visit: Payer: Self-pay | Admitting: *Deleted

## 2012-08-25 MED ORDER — SIMVASTATIN 20 MG PO TABS: 20.0000 mg | ORAL_TABLET | Freq: Every day | ORAL | Status: DC

## 2012-08-26 ENCOUNTER — Other Ambulatory Visit: Payer: Self-pay | Admitting: *Deleted

## 2012-08-26 MED ORDER — SIMVASTATIN 20 MG PO TABS: 20.0000 mg | ORAL_TABLET | Freq: Every day | ORAL | Status: DC

## 2012-10-31 ENCOUNTER — Other Ambulatory Visit: Payer: Self-pay | Admitting: *Deleted

## 2012-10-31 MED ORDER — METOPROLOL SUCCINATE ER 50 MG PO TB24: 50.0000 mg | ORAL_TABLET | Freq: Every day | ORAL | Status: DC

## 2012-10-31 NOTE — Telephone Encounter (Signed)
Rx was sent to pharmacy electronically. 

## 2012-11-02 ENCOUNTER — Encounter: Payer: Self-pay | Admitting: *Deleted

## 2012-11-04 ENCOUNTER — Encounter: Payer: Self-pay | Admitting: Cardiovascular Disease

## 2012-11-04 ENCOUNTER — Ambulatory Visit (INDEPENDENT_AMBULATORY_CARE_PROVIDER_SITE_OTHER): Payer: BC Managed Care – PPO | Admitting: Cardiovascular Disease

## 2012-11-04 VITALS — BP 110/76 | HR 56 | Resp 16 | Ht 70.0 in | Wt 232.1 lb

## 2012-11-04 DIAGNOSIS — Z79899 Other long term (current) drug therapy: Secondary | ICD-10-CM

## 2012-11-04 DIAGNOSIS — E669 Obesity, unspecified: Secondary | ICD-10-CM

## 2012-11-04 DIAGNOSIS — I10 Essential (primary) hypertension: Secondary | ICD-10-CM

## 2012-11-04 DIAGNOSIS — E785 Hyperlipidemia, unspecified: Secondary | ICD-10-CM

## 2012-11-04 DIAGNOSIS — E66811 Obesity, class 1: Secondary | ICD-10-CM

## 2012-11-04 DIAGNOSIS — I2510 Atherosclerotic heart disease of native coronary artery without angina pectoris: Secondary | ICD-10-CM

## 2012-11-04 LAB — LIPID PANEL
Cholesterol: 136 mg/dL (ref 0–200)
Triglycerides: 109 mg/dL (ref <150)
VLDL: 22 mg/dL (ref 0–40)

## 2012-11-04 LAB — COMPREHENSIVE METABOLIC PANEL
ALT: 23 U/L (ref 0–53)
Albumin: 4.3 g/dL (ref 3.5–5.2)
CO2: 30 mEq/L (ref 19–32)
Calcium: 9.8 mg/dL (ref 8.4–10.5)
Chloride: 104 mEq/L (ref 96–112)
Glucose, Bld: 86 mg/dL (ref 70–99)
Potassium: 4.7 mEq/L (ref 3.5–5.3)
Sodium: 138 mEq/L (ref 135–145)
Total Protein: 6.9 g/dL (ref 6.0–8.3)

## 2012-11-04 MED ORDER — METOPROLOL SUCCINATE ER 25 MG PO TB24: 25.0000 mg | ORAL_TABLET | Freq: Every day | ORAL | Status: DC

## 2012-11-04 NOTE — Patient Instructions (Addendum)
Your physician recommends that you schedule a follow-up appointment in: 1 year Your physician recommends that you return for lab work when fasting. Your physician has recommended you make the following change in your medication: reduce metoprolol succinate to 25 mg daily.

## 2012-11-05 ENCOUNTER — Encounter: Payer: Self-pay | Admitting: Cardiovascular Disease

## 2012-11-05 DIAGNOSIS — E66811 Obesity, class 1: Secondary | ICD-10-CM | POA: Insufficient documentation

## 2012-11-05 DIAGNOSIS — I2510 Atherosclerotic heart disease of native coronary artery without angina pectoris: Secondary | ICD-10-CM | POA: Insufficient documentation

## 2012-11-05 DIAGNOSIS — I10 Essential (primary) hypertension: Secondary | ICD-10-CM | POA: Insufficient documentation

## 2012-11-05 DIAGNOSIS — E669 Obesity, unspecified: Secondary | ICD-10-CM | POA: Insufficient documentation

## 2012-11-05 DIAGNOSIS — E785 Hyperlipidemia, unspecified: Secondary | ICD-10-CM | POA: Insufficient documentation

## 2012-11-05 NOTE — Progress Notes (Signed)
Patient ID: Christopher Obrien, male   DOB: [birth year], 62 y.o.   MRN: [id number]      Reason for office visit CAD  It has been almost 10 years since Christopher Obrien had a NSTEMI due to total occlusion of the OM1 and received a bare metal stent. He is asymptomatic from a cardiac standpoint and exercises regularly - 30 minutes daily . He had a normal stress nuclear scan in 2010. He has chronically low HDL, but his BP and LDL have been excellent. He does not have any real complaints, but has noted limitation in his ability to increase his heart rate and some fatigue.   Allergies  Allergen Reactions  . Penicillins     Current Outpatient Prescriptions  Medication Sig Dispense Refill  . aspirin 81 MG tablet Take 81 mg by mouth daily.      Marland Kitchen lisinopril (PRINIVIL,ZESTRIL) 5 MG tablet Take 5 mg by mouth daily.      . metoprolol succinate (TOPROL-XL) 25 MG 24 hr tablet Take 1 tablet (25 mg total) by mouth daily. Take with or immediately following a meal.  30 tablet  12  . simvastatin (ZOCOR) 20 MG tablet Take 1 tablet (20 mg total) by mouth at bedtime.  30 tablet  3   No current facility-administered medications for this visit.    Past Medical History  Diagnosis Date  . CAD (coronary artery disease)   . NSTEMI (non-ST elevated myocardial infarction) 2005  . Systemic hypertension   . Hyperlipidemia     Past Surgical History  Procedure Laterality Date  . Coronary angioplasty with stent placement  07/09/2003    bare metal stent to the first oblique marginal artery  . Appendectomy  1995  . Nm myocar perf wall motion  03/15/2008    normal    Family History  Problem Relation Age of Onset  . Adopted: Yes    History   Social History  . Marital Status: Married    Spouse Name: N/A    Number of Children: N/A  . Years of Education: N/A   Occupational History  . Not on file.   Social History Main Topics  . Smoking status: Never Smoker   . Smokeless tobacco: Not on file  . Alcohol Use: No  .  Drug Use: No  . Sexual Activity: Not on file   Other Topics Concern  . Not on file   Social History Narrative  . No narrative on file    Review of systems: The patient specifically denies any chest pain at rest or with exertion, dyspnea at rest or with exertion, orthopnea, paroxysmal nocturnal dyspnea, syncope, palpitations, focal neurological deficits, intermittent claudication, lower extremity edema, unexplained weight gain, cough, hemoptysis or wheezing.  The patient also denies abdominal pain, nausea, vomiting, dysphagia, diarrhea, constipation, polyuria, polydipsia, dysuria, hematuria, frequency, urgency, abnormal bleeding or bruising, fever, chills, unexpected weight changes, mood swings, change in skin or hair texture, change in voice quality, auditory or visual problems, allergic reactions or rashes, new musculoskeletal complaints other than usual "aches and pains".   PHYSICAL EXAM BP 110/76  Pulse 56  Resp 16  Ht 5\' 10"  (1.778 m)  Wt 232 lb 1.6 oz (105.28 kg)  BMI 33.3 kg/m2  General: Alert, oriented x3, no distress Head: no evidence of trauma, PERRL, EOMI, no exophtalmos or lid lag, no myxedema, no xanthelasma; normal ears, nose and oropharynx Neck: normal jugular venous pulsations and no hepatojugular reflux; brisk carotid pulses without delay and no carotid  bruits Chest: clear to auscultation, no signs of consolidation by percussion or palpation, normal fremitus, symmetrical and full respiratory excursions Cardiovascular: normal position and quality of the apical impulse, regular rhythm, normal first and second heart sounds, no murmurs, rubs or gallops Abdomen: no tenderness or distention, no masses by palpation, no abnormal pulsatility or arterial bruits, normal bowel sounds, no hepatosplenomegaly Extremities: no clubbing, cyanosis or edema; 2+ radial, ulnar and brachial pulses bilaterally; 2+ right femoral, posterior tibial and dorsalis pedis pulses; 2+ left femoral,  posterior tibial and dorsalis pedis pulses; no subclavian or femoral bruits Neurological: grossly nonfocal   EKG: sinus bradycardia, otherwise normal.   Lipid Panel     Component Value Date/Time   CHOL 136 11/04/2012 0935   TRIG 109 11/04/2012 0935   HDL 43 11/04/2012 0935   CHOLHDL 3.2 11/04/2012 0935   VLDL 22 11/04/2012 0935   LDLCALC 71 11/04/2012 0935    BMET    Component Value Date/Time   NA 138 11/04/2012 0935   K 4.7 11/04/2012 0935   CL 104 11/04/2012 0935   CO2 30 11/04/2012 0935   GLUCOSE 86 11/04/2012 0935   BUN 13 11/04/2012 0935   CREATININE 1.02 11/04/2012 0935   CREATININE 0.92 04/19/2008 1445   CALCIUM 9.8 11/04/2012 0935   GFRNONAA >60 04/19/2008 1445   GFRAA  Value: >60        The eGFR has been calculated using the MDRD equation. This calculation has not been validated in all clinical situations. eGFR's persistently <60 mL/min signify possible Chronic Kidney Disease. 04/19/2008 1445     ASSESSMENT AND PLAN Other than his inability to lose more weight, Christopher Obrien is doing well. He should continue daily exercise. We talked about healthy diet changes - he is drinking fruit juices and eating bananas. And could do better with limited carbohydrate intake. Increase protein and unsaturated fat. Recheck lipids (time for yearly evaluation). Reduce metoprolol from 50 to 25 mg daily.  Orders Placed This Encounter  Procedures  . Comp Met (CMET)  . Lipid Profile  . EKG 12-Lead   Meds ordered this encounter  Medications  . metoprolol succinate (TOPROL-XL) 25 MG 24 hr tablet    Sig: Take 1 tablet (25 mg total) by mouth daily. Take with or immediately following a meal.    Dispense:  30 tablet    Refill:  [address]  Thurmon Fair, MD, [facility] [phone] office [phone] pager

## 2012-12-09 ENCOUNTER — Telehealth: Payer: Self-pay | Admitting: *Deleted

## 2012-12-09 NOTE — Telephone Encounter (Signed)
Pt called wanting to set up a TCS. Please advise [phone]

## 2012-12-10 NOTE — Telephone Encounter (Signed)
LMOM to call.

## 2012-12-16 ENCOUNTER — Telehealth: Payer: Self-pay

## 2012-12-16 NOTE — Telephone Encounter (Signed)
Pt was referred by Dr. Phillips Odor for screening colonoscopy. Called and he is in a store. He will call back.

## 2012-12-19 ENCOUNTER — Encounter: Payer: Self-pay | Admitting: Cardiovascular Disease

## 2012-12-24 NOTE — Telephone Encounter (Signed)
Called pt. He is on the road driving. He will call back on Mon after Thanksgiving.

## 2012-12-29 ENCOUNTER — Other Ambulatory Visit: Payer: Self-pay

## 2012-12-29 DIAGNOSIS — Z1211 Encounter for screening for malignant neoplasm of colon: Secondary | ICD-10-CM

## 2012-12-29 NOTE — Telephone Encounter (Signed)
Gastroenterology Pre-Procedure Review  Request Date: 12/29/2012 Requesting Physician: Dr. Phillips Odor  PT WAS ADOPTED AND DOES NOT KNOW FAMILY HISTROY   PATIENT REVIEW QUESTIONS: The patient responded to the following health history questions as indicated:    1. Diabetes Melitis: no 2. Joint replacements in the past 12 months: no 3. Major health problems in the past 3 months: no 4. Has an artificial valve or MVP: no 5. Has a defibrillator: no 6. Has been advised in past to take antibiotics in advance of a procedure like teeth cleaning: no    MEDICATIONS & ALLERGIES:    Patient reports the following regarding taking any blood thinners:   Plavix? no Aspirin? yes Coumadin? no  Patient confirms/reports the following medications:  Current Outpatient Prescriptions  Medication Sig Dispense Refill  . aspirin 81 MG tablet Take 81 mg by mouth daily.      Marland Kitchen lisinopril (PRINIVIL,ZESTRIL) 5 MG tablet Take 5 mg by mouth daily.      . metoprolol succinate (TOPROL-XL) 25 MG 24 hr tablet Take 1 tablet (25 mg total) by mouth daily. Take with or immediately following a meal.  30 tablet  12  . simvastatin (ZOCOR) 20 MG tablet Take 1 tablet (20 mg total) by mouth at bedtime.  30 tablet  3   No current facility-administered medications for this visit.    Patient confirms/reports the following allergies:  Allergies  Allergen Reactions  . Penicillins     No orders of the defined types were placed in this encounter.    AUTHORIZATION INFORMATION Primary Insurance:   ID #:   Group #:  Pre-Cert / Auth required:  Pre-Cert / Auth #:   Secondary Insurance:   ID #:   Group #:  Pre-Cert / Auth required: Pre-Cert / Auth #:   SCHEDULE INFORMATION: Procedure has been scheduled as follows:  Date: 01/12/2013               Time:  10:30 AM Location: [facility] Short Stay  This Gastroenterology Pre-Precedure Review Form is being routed to the following provider(s): Jonette Eva, MD

## 2012-12-29 NOTE — Telephone Encounter (Signed)
Pt called to set up a TCS. Please advise [phone]

## 2012-12-29 NOTE — Telephone Encounter (Signed)
See separate triage.  

## 2012-12-29 NOTE — Telephone Encounter (Signed)
MOVI PREP SPLIT DOSING- CLEAR LIQUIDS WITH BREAKFAST.  PT SHOULD HOLD TOPROL ON AM OF TCS.

## 2012-12-30 MED ORDER — PEG-KCL-NACL-NASULF-NA ASC-C 100 G PO SOLR: 1.0000 | ORAL | Status: DC

## 2012-12-30 NOTE — Telephone Encounter (Signed)
Rx sent to the pharmacy and instructions mailed to pt.  

## 2012-12-31 ENCOUNTER — Encounter (HOSPITAL_COMMUNITY): Payer: Self-pay | Admitting: Pharmacy Technician

## 2013-01-06 ENCOUNTER — Encounter: Payer: Self-pay | Admitting: Cardiology

## 2013-01-09 ENCOUNTER — Other Ambulatory Visit: Payer: Self-pay | Admitting: Cardiovascular Disease

## 2013-01-12 ENCOUNTER — Ambulatory Visit (HOSPITAL_COMMUNITY)
Admission: RE | Admit: 2013-01-12 | Discharge: 2013-01-12 | Disposition: A | Discharge status: Home / Self Care | Payer: BC Managed Care – PPO | Source: Ambulatory Visit | Attending: Gastroenterology | Admitting: Gastroenterology

## 2013-01-12 ENCOUNTER — Encounter (HOSPITAL_COMMUNITY)
Admission: RE | Disposition: A | Discharge status: Home / Self Care | Payer: Self-pay | Source: Ambulatory Visit | Attending: Gastroenterology

## 2013-01-12 ENCOUNTER — Encounter (HOSPITAL_COMMUNITY): Payer: Self-pay

## 2013-01-12 DIAGNOSIS — Z7982 Long term (current) use of aspirin: Secondary | ICD-10-CM | POA: Insufficient documentation

## 2013-01-12 DIAGNOSIS — E785 Hyperlipidemia, unspecified: Secondary | ICD-10-CM | POA: Insufficient documentation

## 2013-01-12 DIAGNOSIS — K648 Other hemorrhoids: Secondary | ICD-10-CM | POA: Insufficient documentation

## 2013-01-12 DIAGNOSIS — K5730 Diverticulosis of large intestine without perforation or abscess without bleeding: Secondary | ICD-10-CM | POA: Insufficient documentation

## 2013-01-12 DIAGNOSIS — Z1211 Encounter for screening for malignant neoplasm of colon: Secondary | ICD-10-CM

## 2013-01-12 DIAGNOSIS — Q438 Other specified congenital malformations of intestine: Secondary | ICD-10-CM

## 2013-01-12 HISTORY — PX: COLONOSCOPY: SHX5424

## 2013-01-12 SURGERY — COLONOSCOPY
Anesthesia: Moderate Sedation

## 2013-01-12 MED ORDER — MIDAZOLAM HCL 5 MG/5ML IJ SOLN
INTRAMUSCULAR | Status: AC
  Filled 2013-01-12: qty 10

## 2013-01-12 MED ORDER — SODIUM CHLORIDE 0.9 % IV SOLN
INTRAVENOUS | Status: DC
  Administered 2013-01-12: 10:00:00 via INTRAVENOUS

## 2013-01-12 MED ORDER — MIDAZOLAM HCL 5 MG/5ML IJ SOLN
INTRAMUSCULAR | Status: DC | PRN
  Administered 2013-01-12 (×2): 2 mg via INTRAVENOUS
  Administered 2013-01-12: 1 mg via INTRAVENOUS

## 2013-01-12 MED ORDER — MEPERIDINE HCL 100 MG/ML IJ SOLN
INTRAMUSCULAR | Status: DC | PRN
  Administered 2013-01-12 (×2): 25 mg via INTRAVENOUS

## 2013-01-12 MED ORDER — STERILE WATER FOR IRRIGATION IR SOLN
Status: DC | PRN
  Administered 2013-01-12: 11:00:00

## 2013-01-12 MED ORDER — MEPERIDINE HCL 100 MG/ML IJ SOLN
INTRAMUSCULAR | Status: AC
  Filled 2013-01-12: qty 2

## 2013-01-12 NOTE — Telephone Encounter (Signed)
Rx was sent to pharmacy electronically. 

## 2013-01-12 NOTE — H&P (Signed)
  Primary Care Physician:  Pcp Not In System Primary Gastroenterologist:  Dr. Darrick Penna  Pre-Procedure History & Physical: HPI:  Christopher Obrien is a 62 y.o. male here for COLON CANCER SCREENING.  Past Medical History  Diagnosis Date  . CAD (coronary artery disease)   . NSTEMI (non-ST elevated myocardial infarction) 2005  . Systemic hypertension   . Hyperlipidemia     Past Surgical History  Procedure Laterality Date  . Coronary angioplasty with stent placement  07/09/2003    bare metal stent to the first oblique marginal artery  . Appendectomy  1995  . Nm myocar perf wall motion  03/15/2008    normal    Prior to Admission medications   Medication Sig Start Date End Date Taking? Authorizing Provider  aspirin 81 MG tablet Take 81 mg by mouth daily.   Yes Historical Provider, MD  lisinopril (PRINIVIL,ZESTRIL) 5 MG tablet TAKE ONE TABLET BY MOUTH EVERY DAY 01/09/13  Yes Mihai Croitoru, MD  metoprolol succinate (TOPROL-XL) 25 MG 24 hr tablet Take 1 tablet (25 mg total) by mouth daily. Take with or immediately following a meal. 11/04/12  Yes Mihai Croitoru, MD  simvastatin (ZOCOR) 20 MG tablet Take 1 tablet (20 mg total) by mouth at bedtime. 08/26/12  Yes Thurmon Fair, MD    Allergies as of 12/29/2012 - Review Complete 12/29/2012  Allergen Reaction Noted  . Penicillins  11/02/2012    Family History  Problem Relation Age of Onset  . Adopted: Yes    History   Social History  . Marital Status: Married    Spouse Name: N/A    Number of Children: N/A  . Years of Education: N/A   Occupational History  . Not on file.   Social History Main Topics  . Smoking status: Never Smoker   . Smokeless tobacco: Not on file  . Alcohol Use: No  . Drug Use: No  . Sexual Activity: Not on file   Other Topics Concern  . Not on file   Social History Narrative  . No narrative on file    Review of Systems: See HPI, otherwise negative ROS   Physical Exam: BP 130/81  Pulse 76  Temp(Src)  98 F (36.7 C) (Oral)  Resp 20  Ht 5\' 10"  (1.778 m)  Wt 222 lb (100.699 kg)  BMI 31.85 kg/m2  SpO2 96% General:   Alert,  pleasant and cooperative in NAD Head:  Normocephalic and atraumatic. Neck:  Supple; Lungs:  Clear throughout to auscultation.    Heart:  Regular rate and rhythm. Abdomen:  Soft, nontender and nondistended. Normal bowel sounds, without guarding, and without rebound.   Neurologic:  Alert and  oriented x4;  grossly normal neurologically.  Impression/Plan:     SCREENING  Plan:  1. TCS TODAY

## 2013-01-12 NOTE — Op Note (Signed)
[facility] [address] [place], [address]   COLONOSCOPY PROCEDURE REPORT  PATIENT: Christopher Obrien, Christopher Obrien  MR#: [id number] BIRTHDATE: 1950-08-02 , 62  yrs. old GENDER: Male ENDOSCOPIST: Jonette Eva, MD REFERRED XB:JYNW Phillips Odor, M.D. PROCEDURE DATE:  01/12/2013 PROCEDURE:   Colonoscopy, screening INDICATIONS:Average risk patient for colon cancer. MEDICATIONS: Demerol 50 mg IV and Versed 5 mg IV  DESCRIPTION OF PROCEDURE:    Physical exam was performed.  Informed consent was obtained from the patient after explaining the benefits, risks, and alternatives to procedure.  The patient was connected to monitor and placed in left lateral position. Continuous oxygen was provided by nasal cannula and IV medicine administered through an indwelling cannula.  After administration of sedation and rectal exam, the patients rectum was intubated and the EC-[id number] ([id number])  colonoscope was advanced under direct visualization to the cecum.  The scope was removed slowly by carefully examining the color, texture, anatomy, and integrity mucosa on the way out.  The patient was recovered in endoscopy and discharged home in satisfactory condition.    COLON FINDINGS: There was moderate diverticulosis noted in the descending colon and sigmoid colon with associated muscular hypertrophy and tortuosity.  , The colon mucosa was otherwise normal.  , Large internal hemorrhoids were found.  , and The colon IS redundant.  Manual abdominal counter-pressure was used to reach the cecum.  PREP QUALITY: good.  CECAL W/D TIME: 16 minutes     COMPLICATIONS: None  ENDOSCOPIC IMPRESSION: 1.   Moderate diverticulosis Min the descending colon and sigmoid colon 2.   The LEFT colon IS redundant 3.   Large internal hemorrhoids  RECOMMENDATIONS: CONTINUE YOUR WEIGHT LOSS EFFORTS. Follow a HIGH FIBER/LOW FAT DIET.  AVOID ITEMS THAT CAUSE BLOATING.  USE PREPARATION H FOUR TIMES A DAY FOR 7 DAYS as needed  for RECTAL BLEEDING.  Next colonoscopy in 10 years. CONSIDER OVERTUBE.       _______________________________ Rosalie DoctorJonette Eva, MD 01/12/2013 11:24 AM

## 2013-01-15 ENCOUNTER — Encounter (HOSPITAL_COMMUNITY): Payer: Self-pay | Admitting: Gastroenterology

## 2013-02-02 ENCOUNTER — Telehealth: Payer: Self-pay | Admitting: Cardiovascular Disease

## 2013-02-02 NOTE — Telephone Encounter (Signed)
Returned call.  Left message to call back before 4pm.  Need to know if pt is switching from local to mail order pharmacy as refills are still active for all three meds.

## 2013-02-02 NOTE — Telephone Encounter (Signed)
[facility] will be contacting him to fill his 3 prescriptions for 90 days.It will be for his Metoprolol,Simvastatin and Lisiniopril. Their phone number [phone]

## 2013-02-03 MED ORDER — LISINOPRIL 5 MG PO TABS: 5.0000 mg | ORAL_TABLET | Freq: Every day | ORAL | Status: DC

## 2013-02-03 MED ORDER — METOPROLOL SUCCINATE ER 25 MG PO TB24: 25.0000 mg | ORAL_TABLET | Freq: Every day | ORAL | Status: DC

## 2013-02-03 MED ORDER — SIMVASTATIN 20 MG PO TABS: 20.0000 mg | ORAL_TABLET | Freq: Every day | ORAL | Status: DC

## 2013-02-03 NOTE — Telephone Encounter (Signed)
Returned call and pt verified x 2.  Pt informed message received and asked if he is switching from local to mail order.  Pt stated he is for cost purposes.  Refill(s) sent to pharmacy.  Pt verbalized understanding and agreed w/ plan.

## 2013-10-08 ENCOUNTER — Other Ambulatory Visit: Payer: Self-pay | Admitting: Cardiovascular Disease

## 2013-10-09 NOTE — Telephone Encounter (Signed)
Rx refill sent to patient pharmacy   

## 2013-10-15 ENCOUNTER — Encounter: Payer: Self-pay | Admitting: Cardiovascular Disease

## 2013-10-15 ENCOUNTER — Telehealth: Payer: Self-pay | Admitting: Cardiovascular Disease

## 2013-10-16 NOTE — Telephone Encounter (Signed)
Closed encounter °

## 2013-11-04 ENCOUNTER — Ambulatory Visit (INDEPENDENT_AMBULATORY_CARE_PROVIDER_SITE_OTHER): Payer: BC Managed Care – PPO | Admitting: Cardiovascular Disease

## 2013-11-04 ENCOUNTER — Encounter: Payer: Self-pay | Admitting: Cardiovascular Disease

## 2013-11-04 VITALS — BP 110/76 | HR 56 | Ht 70.0 in | Wt 223.6 lb

## 2013-11-04 DIAGNOSIS — E669 Obesity, unspecified: Secondary | ICD-10-CM

## 2013-11-04 DIAGNOSIS — I10 Essential (primary) hypertension: Secondary | ICD-10-CM

## 2013-11-04 DIAGNOSIS — E785 Hyperlipidemia, unspecified: Secondary | ICD-10-CM

## 2013-11-04 DIAGNOSIS — R3911 Hesitancy of micturition: Secondary | ICD-10-CM

## 2013-11-04 DIAGNOSIS — I2510 Atherosclerotic heart disease of native coronary artery without angina pectoris: Secondary | ICD-10-CM

## 2013-11-04 DIAGNOSIS — E782 Mixed hyperlipidemia: Secondary | ICD-10-CM

## 2013-11-04 DIAGNOSIS — R011 Cardiac murmur, unspecified: Secondary | ICD-10-CM

## 2013-11-04 DIAGNOSIS — R5381 Other malaise: Secondary | ICD-10-CM

## 2013-11-04 DIAGNOSIS — Z79899 Other long term (current) drug therapy: Secondary | ICD-10-CM

## 2013-11-04 NOTE — Patient Instructions (Signed)
Your physician has requested that you have an echocardiogram. Echocardiography is a painless test that uses sound waves to create images of your heart. It provides your doctor with information about the size and shape of your heart and how well your heart's chambers and valves are working. This procedure takes approximately one hour. There are no restrictions for this procedure.  Your physician recommends that you return for lab work in: [place] at [facility] in [place].  DECREASE Metoprolol to 1/2 tablet for one week then STOP.

## 2013-11-05 LAB — COMPREHENSIVE METABOLIC PANEL
ALBUMIN: 4.3 g/dL (ref 3.5–5.2)
ALT: 22 U/L (ref 0–53)
AST: 23 U/L (ref 0–37)
Alkaline Phosphatase: 70 U/L (ref 39–117)
BUN: 14 mg/dL (ref 6–23)
CO2: 28 mEq/L (ref 19–32)
Calcium: 9.7 mg/dL (ref 8.4–10.5)
Chloride: 104 mEq/L (ref 96–112)
Creat: 1.1 mg/dL (ref 0.50–1.35)
Glucose, Bld: 94 mg/dL (ref 70–99)
Potassium: 5.2 mEq/L (ref 3.5–5.3)
Sodium: 140 mEq/L (ref 135–145)
Total Bilirubin: 0.9 mg/dL (ref 0.2–1.2)
Total Protein: 6.7 g/dL (ref 6.0–8.3)

## 2013-11-05 LAB — CBC
HEMATOCRIT: 41.7 % (ref 39.0–52.0)
Hemoglobin: 15 g/dL (ref 13.0–17.0)
MCH: 30.2 pg (ref 26.0–34.0)
MCHC: 36 g/dL (ref 30.0–36.0)
MCV: 83.9 fL (ref 78.0–100.0)
Platelets: 269 10*3/uL (ref 150–400)
RBC: 4.97 MIL/uL (ref 4.22–5.81)
RDW: 13.3 % (ref 11.5–15.5)
WBC: 7.6 10*3/uL (ref 4.0–10.5)

## 2013-11-05 LAB — LIPID PANEL
CHOLESTEROL: 97 mg/dL (ref 0–200)
HDL: 39 mg/dL — ABNORMAL LOW (ref >39)
LDL CALC: 41 mg/dL (ref 0–99)
Total CHOL/HDL Ratio: 2.5 Ratio
Triglycerides: 84 mg/dL (ref <150)
VLDL: 17 mg/dL (ref 0–40)

## 2013-11-05 NOTE — Progress Notes (Signed)
Patient ID: Christopher Obrien, male   DOB: 07-26-1950, 63 y.o.   MRN: [id number]     Reason for office visit CAD  It has been over 10 years since Christopher Obrien had a NSTEMI due to total occlusion of the OM1 and received a bare metal stent. He exercises regularly - 30 minutes daily. He had a normal stress nuclear scan in 2010. His only complaint is some degree of fatigue and erectile dysfunction. He notes that the erectile dysfunction improved substantially when we cut back his beta blocker dose. He has chronically low HDL, but his BP and LDL have been excellent. He does not have any real complaints, but has noted limitation in his ability to increase his heart rate and some fatigue. He has lost 9 pounds since last year and wants to continue losing weight to a self set target of 190 pounds.   Allergies  Allergen Reactions  . Penicillins Rash    Current Outpatient Prescriptions  Medication Sig Dispense Refill  . aspirin 81 MG tablet Take 81 mg by mouth daily.      Marland Kitchen lisinopril (PRINIVIL,ZESTRIL) 5 MG tablet TAKE 1 TABLET DAILY  90 tablet  0  . metoprolol succinate (TOPROL-XL) 25 MG 24 hr tablet Take 1 tablet (25 mg total) by mouth daily. Take with or immediately following a meal.  90 tablet  2  . simvastatin (ZOCOR) 20 MG tablet TAKE 1 TABLET AT BEDTIME  90 tablet  0   No current facility-administered medications for this visit.    Past Medical History  Diagnosis Date  . CAD (coronary artery disease)   . NSTEMI (non-ST elevated myocardial infarction) 2005  . Systemic hypertension   . Hyperlipidemia     Past Surgical History  Procedure Laterality Date  . Coronary angioplasty with stent placement  07/09/2003    bare metal stent to the first oblique marginal artery  . Appendectomy  1995  . Nm myocar perf wall motion  03/15/2008    normal  . Colonoscopy N/A 01/12/2013    Procedure: COLONOSCOPY;  Surgeon: Danie Binder, MD;  Location[facility];  Service: Endoscopy;  Laterality: N/A;   10:30     Family History  Problem Relation Age of Onset  . Adopted: Yes    History   Social History  . Marital Status: Married    Spouse Name: N/A    Number of Children: N/A  . Years of Education: N/A   Occupational History  . Not on file.   Social History Main Topics  . Smoking status: Never Smoker   . Smokeless tobacco: Not on file  . Alcohol Use: No  . Drug Use: No  . Sexual Activity: Not on file   Other Topics Concern  . Not on file   Social History Narrative  . No narrative on file    Review of systems: The patient specifically denies any chest pain at rest or with exertion, dyspnea at rest or with exertion, orthopnea, paroxysmal nocturnal dyspnea, syncope, palpitations, focal neurological deficits, intermittent claudication, lower extremity edema, unexplained weight gain, cough, hemoptysis or wheezing.  The patient also denies abdominal pain, nausea, vomiting, dysphagia, diarrhea, constipation, polyuria, polydipsia, dysuria, hematuria, frequency, urgency, abnormal bleeding or bruising, fever, chills, unexpected weight changes, mood swings, change in skin or hair texture, change in voice quality, auditory or visual problems, allergic reactions or rashes, new musculoskeletal complaints other than usual "aches and pains".   PHYSICAL EXAM BP 110/76  Pulse 56  Ht  _0  (1.778 m)  Wt 101.424 kg (223 lb 9.6 oz)  BMI 32.08 kg/m2  General: Alert, oriented x3, no distress Head: no evidence of trauma, PERRL, EOMI, no exophtalmos or lid lag, no myxedema, no xanthelasma; normal ears, nose and oropharynx Neck: normal jugular venous pulsations and no hepatojugular reflux; brisk carotid pulses without delay and no carotid bruits Chest: clear to auscultation, no signs of consolidation by percussion or palpation, normal fremitus, symmetrical and full respiratory excursions Cardiovascular: normal position and quality of the apical impulse, regular rhythm, normal first and second  heart sounds, no murmurs, rubs or gallops Abdomen: no tenderness or distention, no masses by palpation, no abnormal pulsatility or arterial bruits, normal bowel sounds, no hepatosplenomegaly Extremities: no clubbing, cyanosis or edema; 2+ radial, ulnar and brachial pulses bilaterally; 2+ right femoral, posterior tibial and dorsalis pedis pulses; 2+ left femoral, posterior tibial and dorsalis pedis pulses; no subclavian or femoral bruits Neurological: grossly nonfocal   EKG: Sinus bradycardia otherwise normal  Lipid Panel     Component Value Date/Time   CHOL 136 11/04/2012 0935   TRIG 109 11/04/2012 0935   HDL 43 11/04/2012 0935   CHOLHDL 3.2 11/04/2012 0935   VLDL 22 11/04/2012 0935   LDLCALC 71 11/04/2012 0935    BMET    Component Value Date/Time   NA 138 11/04/2012 0935   K 4.7 11/04/2012 0935   CL 104 11/04/2012 0935   CO2 30 11/04/2012 0935   GLUCOSE 86 11/04/2012 0935   BUN 13 11/04/2012 0935   CREATININE 1.02 11/04/2012 0935   CREATININE 0.92 04/19/2008 1445   CALCIUM 9.8 11/04/2012 0935   GFRNONAA >60 04/19/2008 1445   GFRAA  Value: >60        The eGFR has been calculated using the MDRD equation. This calculation has not been validated in all clinical situations. eGFR's persistently <60 mL/min signify possible Chronic Kidney Disease. 04/19/2008 1445     ASSESSMENT AND PLAN  10 years since his acute myocardial infarction, Christopher Obrien has not had any recurrence of coronary symptoms and has not required new catheterization or revascularization. He has mild sinus bradycardia and erectile dysfunction, both of which are probably caused or worsened by beta blocker therapy. Have instructed him to gradually wean off the small dose of metoprolol succinate that he still takes.  Last year he had excellent metabolic parameters. He's due to have labs with his primary care physician for the physical on November 15. I suspect his lipid numbers will be even better since he has lost weight. I congratulated  him on the successful weight loss that he has so far achieved and encouraged him to continue his efforts. We'll see him back on a yearly basis.  Orders Placed This Encounter  Procedures  . CBC  . Lipid panel  . PSA  . Comprehensive metabolic panel  . EKG 12-Lead  . 2D Echocardiogram without contrast   No orders of the defined types were placed in this encounter.    Holli Humbles, MD, [place] [phone] office [phone] pager

## 2013-11-06 ENCOUNTER — Ambulatory Visit: Payer: BC Managed Care – PPO | Admitting: Cardiovascular Disease

## 2013-11-06 ENCOUNTER — Telehealth: Payer: Self-pay | Admitting: *Deleted

## 2013-11-06 LAB — PSA: PSA: 0.87 ng/mL (ref <=4.00)

## 2013-11-06 NOTE — Telephone Encounter (Signed)
Message copied by Tressa Busman on Fri Nov 06, 2013  1:36 PM ------      Message from: Sanda Klein      Created: Fri Nov 06, 2013  9:17 AM       All his labs look really good. Please forward a copy to PCP ------

## 2013-11-06 NOTE — Telephone Encounter (Signed)
LM with lab results.  Results faxed to PCP.

## 2013-11-10 ENCOUNTER — Ambulatory Visit (HOSPITAL_COMMUNITY)
Admission: RE | Admit: 2013-11-10 | Discharge: 2013-11-10 | Disposition: A | Discharge status: Home / Self Care | Payer: BC Managed Care – PPO | Source: Ambulatory Visit | Attending: Cardiology | Admitting: Cardiology

## 2013-11-10 DIAGNOSIS — I10 Essential (primary) hypertension: Secondary | ICD-10-CM | POA: Diagnosis not present

## 2013-11-10 DIAGNOSIS — I2510 Atherosclerotic heart disease of native coronary artery without angina pectoris: Secondary | ICD-10-CM

## 2013-11-10 DIAGNOSIS — R011 Cardiac murmur, unspecified: Secondary | ICD-10-CM

## 2013-11-10 DIAGNOSIS — I359 Nonrheumatic aortic valve disorder, unspecified: Secondary | ICD-10-CM | POA: Insufficient documentation

## 2013-11-10 MED ORDER — PERFLUTREN LIPID MICROSPHERE
2.0000 mL | INTRAVENOUS | Status: AC | PRN
  Administered 2013-11-10: 2 mL via INTRAVENOUS

## 2013-11-10 NOTE — Progress Notes (Signed)
2D Echo Performed 11/10/2013    Christopher Obrien, RCS

## 2013-11-10 NOTE — Progress Notes (Signed)
Definity used with Echocardiogram. First BP 138/92. Last BP 136/86.

## 2013-11-12 ENCOUNTER — Telehealth: Payer: Self-pay | Admitting: *Deleted

## 2013-11-12 NOTE — Telephone Encounter (Signed)
Normal echo results called to patient.  Voiced understanding.  Copy faxed to PCP Dr. Ethlyn Gallery [phone].

## 2013-12-18 ENCOUNTER — Other Ambulatory Visit: Payer: Self-pay | Admitting: Cardiovascular Disease

## 2013-12-18 NOTE — Telephone Encounter (Signed)
Rx was sent to pharmacy electronically. 

## 2014-08-09 ENCOUNTER — Encounter (HOSPITAL_COMMUNITY): Payer: Self-pay | Admitting: *Deleted

## 2014-08-09 ENCOUNTER — Emergency Department (HOSPITAL_COMMUNITY)
Admission: EM | Admit: 2014-08-09 | Discharge: 2014-08-09 | Disposition: A | Discharge status: Home / Self Care | Payer: BLUE CROSS/BLUE SHIELD | Attending: Emergency Medicine | Admitting: Emergency Medicine

## 2014-08-09 DIAGNOSIS — S00461A Insect bite (nonvenomous) of right ear, initial encounter: Secondary | ICD-10-CM | POA: Insufficient documentation

## 2014-08-09 DIAGNOSIS — Y998 Other external cause status: Secondary | ICD-10-CM | POA: Insufficient documentation

## 2014-08-09 DIAGNOSIS — Z880 Allergy status to penicillin: Secondary | ICD-10-CM | POA: Diagnosis not present

## 2014-08-09 DIAGNOSIS — T7809XA Anaphylactic reaction due to other food products, initial encounter: Secondary | ICD-10-CM | POA: Insufficient documentation

## 2014-08-09 DIAGNOSIS — Z79899 Other long term (current) drug therapy: Secondary | ICD-10-CM | POA: Diagnosis not present

## 2014-08-09 DIAGNOSIS — R220 Localized swelling, mass and lump, head: Secondary | ICD-10-CM | POA: Diagnosis present

## 2014-08-09 DIAGNOSIS — I252 Old myocardial infarction: Secondary | ICD-10-CM | POA: Diagnosis not present

## 2014-08-09 DIAGNOSIS — E785 Hyperlipidemia, unspecified: Secondary | ICD-10-CM | POA: Diagnosis not present

## 2014-08-09 DIAGNOSIS — Y92096 Garden or yard of other non-institutional residence as the place of occurrence of the external cause: Secondary | ICD-10-CM | POA: Insufficient documentation

## 2014-08-09 DIAGNOSIS — S40862A Insect bite (nonvenomous) of left upper arm, initial encounter: Secondary | ICD-10-CM | POA: Insufficient documentation

## 2014-08-09 DIAGNOSIS — Y9389 Activity, other specified: Secondary | ICD-10-CM | POA: Insufficient documentation

## 2014-08-09 DIAGNOSIS — W57XXXA Bitten or stung by nonvenomous insect and other nonvenomous arthropods, initial encounter: Secondary | ICD-10-CM | POA: Insufficient documentation

## 2014-08-09 DIAGNOSIS — Z7982 Long term (current) use of aspirin: Secondary | ICD-10-CM | POA: Diagnosis not present

## 2014-08-09 DIAGNOSIS — I10 Essential (primary) hypertension: Secondary | ICD-10-CM | POA: Insufficient documentation

## 2014-08-09 DIAGNOSIS — I2510 Atherosclerotic heart disease of native coronary artery without angina pectoris: Secondary | ICD-10-CM | POA: Insufficient documentation

## 2014-08-09 DIAGNOSIS — T782XXA Anaphylactic shock, unspecified, initial encounter: Secondary | ICD-10-CM

## 2014-08-09 MED ORDER — METHYLPREDNISOLONE SODIUM SUCC 125 MG IJ SOLR
INTRAMUSCULAR | Status: AC
  Administered 2014-08-09: 125 mg
  Filled 2014-08-09: qty 2

## 2014-08-09 MED ORDER — PREDNISONE 20 MG PO TABS: ORAL_TABLET | ORAL | Status: DC

## 2014-08-09 MED ORDER — SODIUM CHLORIDE 0.9 % IV SOLN
Freq: Once | INTRAVENOUS | Status: AC
  Administered 2014-08-09: 12:00:00 via INTRAVENOUS

## 2014-08-09 MED ORDER — METOCLOPRAMIDE HCL 5 MG/ML IJ SOLN
INTRAMUSCULAR | Status: AC
  Filled 2014-08-09: qty 2

## 2014-08-09 MED ORDER — FAMOTIDINE IN NACL 20-0.9 MG/50ML-% IV SOLN
INTRAVENOUS | Status: AC
  Administered 2014-08-09: 20 mg
  Filled 2014-08-09: qty 50

## 2014-08-09 MED ORDER — EPINEPHRINE 0.3 MG/0.3ML IJ SOAJ
INTRAMUSCULAR | Status: AC
  Administered 2014-08-09: 0.3 mg
  Filled 2014-08-09: qty 0.3

## 2014-08-09 MED ORDER — DIPHENHYDRAMINE HCL 50 MG/ML IJ SOLN
INTRAMUSCULAR | Status: AC
  Administered 2014-08-09: 25 mg
  Filled 2014-08-09: qty 1

## 2014-08-09 MED ORDER — EPINEPHRINE 0.3 MG/0.3ML IJ SOAJ: 0.3000 mg | Freq: Once | INTRAMUSCULAR | Status: DC

## 2014-08-09 NOTE — ED Notes (Signed)
Pt resting more comfortably- Hives already lessened . Pt stated that he feels better but sleepy

## 2014-08-09 NOTE — ED Notes (Signed)
Pt remains on stretcher in hall way - under constant monitoring - Wife at bedside

## 2014-08-09 NOTE — ED Provider Notes (Signed)
CSN: [id number]     Arrival date & time 08/09/14  1142 History  This chart was scribed for Elnora Morrison, MD by Mercy Moore, ED scribe.  This patient was seen in room [facility] and the patient's care was started at 12:11 PM.   Chief Complaint  Patient presents with  . Insect Bite  . Allergic Reaction   The history is provided by the patient and the spouse. No language interpreter was used.   HPI Comments: Christopher Obrien is a 64 y.o. male who presents to the Emergency Department with anaphylactic reaction to multiple insect stings this morning, approximately one hour ago. Patient stung to left posterior upper arm and right ear after driving over what appeared to be a hornet's nest when performing yard work. Patient reports onset of  throat swelling, lip swelling, difficulty breathing and development of spreading, generalized rash soon after being stung. Patient reports gradual resolution of these symptoms.    Past Medical History  Diagnosis Date  . CAD (coronary artery disease)   . NSTEMI (non-ST elevated myocardial infarction) 2005  . Systemic hypertension   . Hyperlipidemia    Past Surgical History  Procedure Laterality Date  . Coronary angioplasty with stent placement  07/09/2003    bare metal stent to the first oblique marginal artery  . Appendectomy  1995  . Nm myocar perf wall motion  03/15/2008    normal  . Colonoscopy N/A 01/12/2013    Procedure: COLONOSCOPY;  Surgeon: Danie Binder, MD;  Location: [facility];  Service: Endoscopy;  Laterality: N/A;  10:30    Family History  Problem Relation Age of Onset  . Adopted: Yes   History  Substance Use Topics  . Smoking status: Never Smoker   . Smokeless tobacco: Not on file  . Alcohol Use: No    Review of Systems  Constitutional: Negative for fever.  HENT: Negative for sore throat and trouble swallowing.   Respiratory: Positive for shortness of breath. Negative for chest tightness.   Gastrointestinal: Negative for  abdominal pain.  Skin: Positive for rash.      Allergies  Bee venom and Penicillins  Home Medications   Prior to Admission medications   Medication Sig Start Date End Date Taking? Authorizing Provider  aspirin 81 MG tablet Take 81 mg by mouth daily.   Yes Historical Provider, MD  lisinopril (PRINIVIL,ZESTRIL) 5 MG tablet Take 1 tablet (5 mg total) by mouth daily. 12/18/13  Yes Mihai Croitoru, MD  Phenylephrine-DM-GG-APAP (VICKS DAYQUIL SEVERE COLD/FLU PO) Take 2 capsules by mouth every 4 (four) hours as needed (cold).   Yes Historical Provider, MD  simvastatin (ZOCOR) 20 MG tablet Take 1 tablet (20 mg total) by mouth at bedtime. 12/18/13  Yes Mihai Croitoru, MD  EPINEPHrine 0.3 mg/0.3 mL IJ SOAJ injection Inject 0.3 mLs (0.3 mg total) into the muscle once. 08/09/14   Elnora Morrison, MD  predniSONE (DELTASONE) 20 MG tablet 2 tabs po daily x 3 days 08/09/14   Elnora Morrison, MD   Triage Vitals: BP 109/89 mmHg  Pulse 86  Temp(Src) 98 F (36.7 C) (Oral)  Resp 16  SpO2 98% Physical Exam  Constitutional: He is oriented to person, place, and time. He appears well-developed and well-nourished. No distress.  HENT:  Head: Normocephalic and atraumatic.  No edema to back of throat.   Eyes: EOM are normal.  Neck: Neck supple. No tracheal deviation present.  Cardiovascular: Normal rate.   Pulmonary/Chest: Effort normal and breath sounds normal. No  respiratory distress. He has no wheezes. He has no rales.  Abdominal: Soft. There is no tenderness.  Musculoskeletal: Normal range of motion.  Neurological: He is alert and oriented to person, place, and time.  Skin: Skin is warm and dry.  Mild hive like/papular rash to forearms bilaterally. No significant wounds, drainage, swelling, or rash to sites of insect bites.   Psychiatric: He has a normal mood and affect. His behavior is normal.  Nursing note and vitals reviewed.   ED Course  Procedures (including critical care time) CRITICAL  CARE Performed by: Mariea Clonts   Total critical care time: 35 min  Critical care time was exclusive of separately billable procedures and treating other patients.  Critical care was necessary to treat or prevent imminent or life-threatening deterioration.  Critical care was time spent personally by me on the following activities: development of treatment plan with patient and/or surrogate as well as nursing, discussions with consultants, evaluation of patient's response to treatment, examination of patient, obtaining history from patient or surrogate, ordering and performing treatments and interventions, ordering and review of laboratory studies, ordering and review of radiographic studies, pulse oximetry and re-evaluation of patient's condition.  COORDINATION OF CARE: 12:15 PM- Discussed treatment plan with patient at bedside and patient agreed to plan.   Labs Review Labs Reviewed - No data to display  Imaging Review No results found.   EKG Interpretation None      MDM   Final diagnoses:  Anaphylaxis, initial encounter  Insect bite   Patient presents with hives and throat swelling from insect bite prior to arrival. Due to clinical presentation epinephrine intramuscular given, IV fluids, and he has to means and observation in the ER is the plan.  Patient observed in the ER, symptoms resolved with emergent treatment. Patient will go home with his wife and reasons to return given. Results and differential diagnosis were discussed with the patient/parent/guardian. Xrays were independently reviewed by myself.  Close follow up outpatient was discussed, comfortable with the plan.   Medications  EPINEPHrine (EPI-PEN) 0.3 mg/0.3 mL injection (0.3 mg  Given 08/09/14 1202)  diphenhydrAMINE (BENADRYL) 50 MG/ML injection (25 mg  Given 08/09/14 1202)  methylPREDNISolone sodium succinate (SOLU-MEDROL) 125 mg/2 mL injection (125 mg  Given 08/09/14 1202)  famotidine (PEPCID) 20-0.9  MG/50ML-% IVPB (  Stopped 08/09/14 1221)  0.9 %  sodium chloride infusion ( Intravenous Stopped 08/09/14 1356)    Filed Vitals:   08/09/14 1202 08/09/14 1222 08/09/14 1311 08/09/14 1414  BP: 109/89 106/64 116/70 124/71  Pulse: 86 82 86 82  Temp:      TempSrc:      Resp:  16 18 18   SpO2: 98% 95% 96% 96%    Final diagnoses:  Anaphylaxis, initial encounter  Insect bite       Elnora Morrison, MD 08/09/14 1432

## 2014-08-09 NOTE — Discharge Instructions (Signed)
If he develop throat swelling, difficulty breathing please use EpiPen and come to the nearest emergency department. All the primary Dr. for allergy testing arrangements.  If you were given medicines take as directed.  If you are on coumadin or contraceptives realize their levels and effectiveness is altered by many different medicines.  If you have any reaction (rash, tongues swelling, other) to the medicines stop taking and see a physician.    If your blood pressure was elevated in the ER make sure you follow up for management with a primary doctor or return for chest pain, shortness of breath or stroke symptoms.  Please follow up as directed and return to the ER or see a physician for new or worsening symptoms.  Thank you. Filed Vitals:   08/09/14 1151 08/09/14 1202 08/09/14 1222  BP: 93/61 109/89 106/64  Pulse: 84 86 82  Temp: 98 F (36.7 C)    TempSrc: Oral    Resp: 16  16  SpO2: 96% 98% 95%    Anaphylactic Reaction An anaphylactic reaction is a sudden, severe allergic reaction that involves the whole body. It can be life threatening. A hospital stay is often required. People with asthma, eczema, or hay fever are slightly more likely to have an anaphylactic reaction. CAUSES  An anaphylactic reaction may be caused by anything to which you are allergic. After being exposed to the allergic substance, your immune system becomes sensitized to it. When you are exposed to that allergic substance again, an allergic reaction can occur. Common causes of an anaphylactic reaction include:  Medicines.  Foods, especially peanuts, wheat, shellfish, milk, and eggs.  Insect bites or stings.  Blood products.  Chemicals, such as dyes, latex, and contrast material used for imaging tests. SYMPTOMS  When an allergic reaction occurs, the body releases histamine and other substances. These substances cause symptoms such as tightening of the airway. Symptoms often develop within seconds or minutes of  exposure. Symptoms may include:  Skin rash or hives.  Itching.  Chest tightness.  Swelling of the eyes, tongue, or lips.  Trouble breathing or swallowing.  Lightheadedness or fainting.  Anxiety or confusion.  Stomach pains, vomiting, or diarrhea.  Nasal congestion.  A fast or irregular heartbeat (palpitations). DIAGNOSIS  Diagnosis is based on your history of recent exposure to allergic substances, your symptoms, and a physical exam. Your caregiver may also perform blood or urine tests to confirm the diagnosis. TREATMENT  Epinephrine medicine is the main treatment for an anaphylactic reaction. Other medicines that may be used for treatment include antihistamines, steroids, and albuterol. In severe cases, fluids and medicine to support blood pressure may be given through an intravenous line (IV). Even if you improve after treatment, you need to be observed to make sure your condition does not get worse. This may require a stay in the hospital. [place] a medical alert bracelet or necklace stating your allergy.  You and your family must learn how to use an anaphylaxis kit or give an epinephrine injection to temporarily treat an emergency allergic reaction. Always carry your epinephrine injection or anaphylaxis kit with you. This can be lifesaving if you have a severe reaction.  Do not drive or perform tasks after treatment until the medicines used to treat your reaction have worn off, or until your caregiver says it is okay.  If you have hives or a rash:  Take medicines as directed by your caregiver.  You may use an over-the-counter antihistamine (diphenhydramine)  as needed.  Apply cold compresses to the skin or take baths in cool water. Avoid hot baths or showers. SEEK MEDICAL CARE IF:   You develop symptoms of an allergic reaction to a new substance. Symptoms may start right away or minutes later.  You develop a rash, hives, or itching.  You develop  new symptoms. SEEK IMMEDIATE MEDICAL CARE IF:   You have swelling of the mouth, difficulty breathing, or wheezing.  You have a tight feeling in your chest or throat.  You develop hives, swelling, or itching all over your body.  You develop severe vomiting or diarrhea.  You feel faint or pass out. This is an emergency. Use your epinephrine injection or anaphylaxis kit as you have been instructed. Call your local emergency services (911 in [place] Even if you improve after the injection, you need to be examined at a hospital emergency department. MAKE SURE YOU:   Understand these instructions.  Will watch your condition.  Will get help right away if you are not doing well or get worse. Document Released: 01/15/2005 Document Revised: 01/20/2013 Document Reviewed: 04/18/2011 [facility] Patient Information 2015 [place], [place]. This information is not intended to replace advice given to you by your health care provider. Make sure you discuss any questions you have with your health care provider.

## 2014-08-09 NOTE — ED Notes (Signed)
Discharge instructions reviewed with pt and his wife, Discussed need to continue taking Benadryl as instructed on bottle for next couple of days and reasoning why . Verbalized understanding . Pt ambulated off unit with his wife

## 2014-08-09 NOTE — ED Notes (Signed)
Patient stung on back of L upper arm and R ear about 1 hour ago by bee.  Has begun having difficulty breathing, breaking out in generalized rash.

## 2014-10-21 ENCOUNTER — Encounter: Payer: Self-pay | Admitting: Cardiovascular Disease

## 2014-11-15 ENCOUNTER — Encounter: Payer: Self-pay | Admitting: Cardiovascular Disease

## 2014-11-15 ENCOUNTER — Ambulatory Visit (INDEPENDENT_AMBULATORY_CARE_PROVIDER_SITE_OTHER): Payer: BLUE CROSS/BLUE SHIELD | Admitting: Cardiovascular Disease

## 2014-11-15 VITALS — BP 130/80 | HR 60 | Ht 70.0 in | Wt 222.0 lb

## 2014-11-15 DIAGNOSIS — I2510 Atherosclerotic heart disease of native coronary artery without angina pectoris: Secondary | ICD-10-CM | POA: Diagnosis not present

## 2014-11-15 DIAGNOSIS — E785 Hyperlipidemia, unspecified: Secondary | ICD-10-CM

## 2014-11-15 DIAGNOSIS — E669 Obesity, unspecified: Secondary | ICD-10-CM

## 2014-11-15 DIAGNOSIS — I10 Essential (primary) hypertension: Secondary | ICD-10-CM | POA: Diagnosis not present

## 2014-11-15 DIAGNOSIS — Z79899 Other long term (current) drug therapy: Secondary | ICD-10-CM | POA: Diagnosis not present

## 2014-11-15 LAB — COMPREHENSIVE METABOLIC PANEL
ALK PHOS: 67 U/L (ref 40–115)
ALT: 24 U/L (ref 9–46)
AST: 21 U/L (ref 10–35)
Albumin: 4.2 g/dL (ref 3.6–5.1)
BUN: 12 mg/dL (ref 7–25)
CALCIUM: 9.4 mg/dL (ref 8.6–10.3)
CHLORIDE: 104 mmol/L (ref 98–110)
CO2: 27 mmol/L (ref 20–31)
Creat: 0.89 mg/dL (ref 0.70–1.25)
GLUCOSE: 88 mg/dL (ref 65–99)
POTASSIUM: 4.5 mmol/L (ref 3.5–5.3)
Sodium: 140 mmol/L (ref 135–146)
TOTAL PROTEIN: 6.7 g/dL (ref 6.1–8.1)
Total Bilirubin: 1 mg/dL (ref 0.2–1.2)

## 2014-11-15 LAB — LIPID PANEL
Cholesterol: 123 mg/dL — ABNORMAL LOW (ref 125–200)
HDL: 37 mg/dL — AB (ref >=40)
LDL CALC: 65 mg/dL (ref <130)
TRIGLYCERIDES: 103 mg/dL (ref <150)
Total CHOL/HDL Ratio: 3.3 Ratio (ref <=5.0)
VLDL: 21 mg/dL (ref <30)

## 2014-11-15 MED ORDER — SIMVASTATIN 20 MG PO TABS: 20.0000 mg | ORAL_TABLET | Freq: Every day | ORAL | Status: DC

## 2014-11-15 MED ORDER — LISINOPRIL 5 MG PO TABS: 5.0000 mg | ORAL_TABLET | Freq: Every day | ORAL | Status: DC

## 2014-11-15 NOTE — Progress Notes (Signed)
Patient ID: Christopher Obrien, male   DOB: [birth year], 64 y.o.   MRN: [id number]     Cardiology Office Note   Date:  11/15/2014   ID:  Christopher Obrien, DOB 1951-01-23, MRN [id number]  PCP:  Rocky Morel, MD  Cardiologist:   Sanda Klein, MD   Chief Complaint  Patient presents with  . Annual Exam  . Coronary Artery Disease      History of Present Illness: Christopher Obrien is a 64 y.o. male who presents for  Follow-up for coronary artery disease.  It has been about 11 years since he had a non-ST segment elevation myocardial infarction and he has not had any interval events.  He denies any cardiovascular complaints since his last appointment despite being physically quite active. He had insect bite related anaphylaxis a few months ago. He did not have angina pectoris even when he received epinephrine.    Past Medical History  Diagnosis Date  . CAD (coronary artery disease)   . NSTEMI (non-ST elevated myocardial infarction) ([place]) 2005  . Systemic hypertension   . Hyperlipidemia     Past Surgical History  Procedure Laterality Date  . Coronary angioplasty with stent placement  07/09/2003    bare metal stent to the first oblique marginal artery  . Appendectomy  1995  . Nm myocar perf wall motion  03/15/2008    normal  . Colonoscopy N/A 01/12/2013    Procedure: COLONOSCOPY;  Surgeon: Danie Binder, MD;  [facility];  Service: Endoscopy;  Laterality: N/A;  10:30      Current Outpatient Prescriptions  Medication Sig Dispense Refill  . aspirin 81 MG tablet Take 81 mg by mouth daily.    Marland Kitchen EPINEPHrine 0.3 mg/0.3 mL IJ SOAJ injection Inject 0.3 mLs (0.3 mg total) into the muscle once. (Patient taking differently: Inject 0.3 mg into the muscle once as needed (anaphylaxis). ) 1 Device 1  . lisinopril (PRINIVIL,ZESTRIL) 5 MG tablet Take 1 tablet (5 mg total) by mouth daily. 90 tablet 3  . simvastatin (ZOCOR) 20 MG tablet Take 1 tablet (20 mg total) by mouth at bedtime. 90  tablet 3   No current facility-administered medications for this visit.    Allergies:   Bee venom and Penicillins    Social History:  The patient  reports that he has never smoked. He does not have any smokeless tobacco history on file. He reports that he does not drink alcohol or use illicit drugs.   Family History:  The patient's family history is not known. He was adopted.    ROS:  Please see the history of present illness.    The patient specifically denies any chest pain at rest or with exertion, dyspnea at rest or with exertion, orthopnea, paroxysmal nocturnal dyspnea, syncope, palpitations, focal neurological deficits, intermittent claudication, lower extremity edema, unexplained weight gain, cough, hemoptysis or wheezing.  The patient also denies abdominal pain, nausea, vomiting, dysphagia, diarrhea, constipation, polyuria, polydipsia, dysuria, hematuria, frequency, urgency, abnormal bleeding or bruising, fever, chills, unexpected weight changes, mood swings, change in skin or hair texture, change in voice quality, auditory or visual problems, allergic reactions or rashes, new musculoskeletal complaints other than usual "aches and pains".  Otherwise, review of systems positive for none.   All other systems are reviewed and negative.    PHYSICAL EXAM: VS:  BP 130/80 mmHg  Pulse 60  Ht 5\' 10"  (1.778 m)  Wt 222 lb (100.699 kg)  BMI 31.85 kg/m2 , BMI Body  mass index is 31.85 kg/(m^2).  General: Alert, oriented x3, no distress Head: no evidence of trauma, PERRL, EOMI, no exophtalmos or lid lag, no myxedema, no xanthelasma; normal ears, nose and oropharynx Neck: normal jugular venous pulsations and no hepatojugular reflux; brisk carotid pulses without delay and no carotid bruits Chest: clear to auscultation, no signs of consolidation by percussion or palpation, normal fremitus, symmetrical and full respiratory excursions Cardiovascular: normal position and quality of the apical  impulse, regular rhythm, normal first and second heart sounds, 1/6 early peaking aortic ejection murmur, no rubs or gallops Abdomen: no tenderness or distention, no masses by palpation, no abnormal pulsatility or arterial bruits, normal bowel sounds, no hepatosplenomegaly Extremities: no clubbing, cyanosis or edema; 2+ radial, ulnar and brachial pulses bilaterally; 2+ right femoral, posterior tibial and dorsalis pedis pulses; 2+ left femoral, posterior tibial and dorsalis pedis pulses; no subclavian or femoral bruits Neurological: grossly nonfocal Psych: euthymic mood, full affect   EKG:  EKG is ordered today. The ekg ordered today demonstrates  Normal sinus rhythm, normal tracing   Recent Labs: No results found for requested labs within last 365 days.    Lipid Panel    Component Value Date/Time   CHOL 97 11/04/2013 0950   TRIG 84 11/04/2013 0950   HDL 39* 11/04/2013 0950   CHOLHDL 2.5 11/04/2013 0950   VLDL 17 11/04/2013 0950   LDLCALC 41 11/04/2013 0950      Wt Readings from Last 3 Encounters:  11/15/14 222 lb (100.699 kg)  11/04/13 223 lb 9.6 oz (101.424 kg)  01/12/13 222 lb (100.699 kg)     ASSESSMENT AND PLAN:   Mr. Pheasant remains asymptomatic from a cardiac point of view, roughly 11 years since he had a heart attack. After discontinuation of his beta blocker he feels much better with improved energy and resolution of erectile dysfunction. His lipid parameters are excellent , but he is due a repeat evaluation. Still encourage him to try to lose weight. Will continue with yearly follow-up.    Current medicines are reviewed at length with the patient today.  The patient does not have concerns regarding medicines.  The following changes have been made:  no change  Labs/ tests ordered today include:   Orders Placed This Encounter  Procedures  . Lipid panel  . Comprehensive metabolic panel  . EKG 12-Lead      Patient Instructions  Your physician recommends that  you return for lab work [place].  Dr Sallyanne Kuster recommends that you schedule a follow-up appointment in 1 year. You will receive a reminder letter in the mail two months in advance. If you don't receive a letter, please call our office to schedule the follow-up appointment.  If you need a refill on your cardiac medications before your next appointment, please call your pharmacy.      Mikael Spray, MD  11/15/2014 2:04 PM    Sanda Klein, MD, [facility] [phone] office [phone] pager

## 2014-11-15 NOTE — Patient Instructions (Signed)
Your physician recommends that you return for lab work TODAY - FASTING.  Dr Sallyanne Kuster recommends that you schedule a follow-up appointment in 1 year. You will receive a reminder letter in the mail two months in advance. If you don't receive a letter, please call our office to schedule the follow-up appointment.  If you need a refill on your cardiac medications before your next appointment, please call your pharmacy.

## 2015-07-25 DIAGNOSIS — X32XXXD Exposure to sunlight, subsequent encounter: Secondary | ICD-10-CM | POA: Diagnosis not present

## 2015-07-25 DIAGNOSIS — D1801 Hemangioma of skin and subcutaneous tissue: Secondary | ICD-10-CM | POA: Diagnosis not present

## 2015-07-25 DIAGNOSIS — Z08 Encounter for follow-up examination after completed treatment for malignant neoplasm: Secondary | ICD-10-CM | POA: Diagnosis not present

## 2015-07-25 DIAGNOSIS — Z85828 Personal history of other malignant neoplasm of skin: Secondary | ICD-10-CM | POA: Diagnosis not present

## 2015-07-25 DIAGNOSIS — L570 Actinic keratosis: Secondary | ICD-10-CM | POA: Diagnosis not present

## 2015-07-27 DIAGNOSIS — I10 Essential (primary) hypertension: Secondary | ICD-10-CM | POA: Diagnosis not present

## 2015-07-27 DIAGNOSIS — Z6832 Body mass index (BMI) 32.0-32.9, adult: Secondary | ICD-10-CM | POA: Diagnosis not present

## 2015-07-27 DIAGNOSIS — E782 Mixed hyperlipidemia: Secondary | ICD-10-CM | POA: Diagnosis not present

## 2015-07-27 DIAGNOSIS — J018 Other acute sinusitis: Secondary | ICD-10-CM | POA: Diagnosis not present

## 2015-11-21 ENCOUNTER — Ambulatory Visit (INDEPENDENT_AMBULATORY_CARE_PROVIDER_SITE_OTHER): Payer: Medicare Other | Admitting: Cardiovascular Disease

## 2015-11-21 ENCOUNTER — Encounter: Payer: Self-pay | Admitting: Cardiovascular Disease

## 2015-11-21 VITALS — BP 112/70 | HR 62 | Ht 70.0 in | Wt 218.1 lb

## 2015-11-21 DIAGNOSIS — R350 Frequency of micturition: Secondary | ICD-10-CM | POA: Diagnosis not present

## 2015-11-21 DIAGNOSIS — I2510 Atherosclerotic heart disease of native coronary artery without angina pectoris: Secondary | ICD-10-CM

## 2015-11-21 DIAGNOSIS — E785 Hyperlipidemia, unspecified: Secondary | ICD-10-CM | POA: Diagnosis not present

## 2015-11-21 DIAGNOSIS — Z79899 Other long term (current) drug therapy: Secondary | ICD-10-CM | POA: Diagnosis not present

## 2015-11-21 NOTE — Patient Instructions (Signed)
Medication Instructions: Dr Croitoru recommends that you continue on your current medications as directed. Please refer to the Current Medication list given to you today.  Labwork: Your physician recommends that you return for lab work at your earliest convenience - FASTING.   Testing/Procedures: NONE ORDERED  Follow-up: Dr Croitoru recommends that you schedule a follow-up appointment in 12 months. You will receive a reminder letter in the mail two months in advance. If you don't receive a letter, please call our office to schedule the follow-up appointment.  If you need a refill on your cardiac medications before your next appointment, please call your pharmacy. 

## 2015-11-21 NOTE — Progress Notes (Signed)
Cardiology Office Note    Date:  11/22/2015   ID:  Christopher Obrien, DOB Mar 25, 1950, MRN [id number]  PCP:  Rocky Morel, MD  Cardiologist:   Sanda Klein, MD   Chief Complaint  Patient presents with  . Follow-up    no complaints today-no chest pain    History of Present Illness:  Christopher Obrien is a 65 y.o. male with hyperlipidemia hypertension and a remote history of non-STEMI and stent to oblique marginal artery in 2005, returning for routine follow-up. He has felt well since his last appointment and has started more rigorous program of physical exercise. He goes to the gym for 5 days a week doing a combination of cardio and weight training. He routinely increases his heart rate 260 bpm during exercise. He has lost 5 pounds and once to reduce his weight down 295 pounds. Currently his waistline is about 38-39 inches. Denies dizziness, palpitations, shortness of breath, edema, claudication or focal neurological complaints.    Past Medical History:  Diagnosis Date  . CAD (coronary artery disease)   . Hyperlipidemia   . NSTEMI (non-ST elevated myocardial infarction) ([place]) 2005  . Systemic hypertension     Past Surgical History:  Procedure Laterality Date  . APPENDECTOMY  1995  . COLONOSCOPY N/A 01/12/2013   Procedure: COLONOSCOPY;  Surgeon: Danie Binder, MD;  [facility];  Service: Endoscopy;  Laterality: N/A;  10:30   . CORONARY ANGIOPLASTY WITH STENT PLACEMENT  07/09/2003   bare metal stent to the first oblique marginal artery  . NM MYOCAR PERF WALL MOTION  03/15/2008   normal    Current Medications: Outpatient Medications Prior to Visit  Medication Sig Dispense Refill  . aspirin 81 MG tablet Take 81 mg by mouth daily.    Marland Kitchen EPINEPHrine 0.3 mg/0.3 mL IJ SOAJ injection Inject 0.3 mLs (0.3 mg total) into the muscle once. (Patient taking differently: Inject 0.3 mg into the muscle once as needed (anaphylaxis). ) 1 Device 1  . lisinopril (PRINIVIL,ZESTRIL) 5  MG tablet Take 1 tablet (5 mg total) by mouth daily. 90 tablet 3  . simvastatin (ZOCOR) 20 MG tablet Take 1 tablet (20 mg total) by mouth at bedtime. 90 tablet 3   No facility-administered medications prior to visit.      Allergies:   Bee venom and Penicillins   Social History   Social History  . Marital status: Married    Spouse name: N/A  . Number of children: N/A  . Years of education: N/A   Social History Main Topics  . Smoking status: Never Smoker  . Smokeless tobacco: None  . Alcohol use No  . Drug use: No  . Sexual activity: Not Asked   Other Topics Concern  . None   Social History Narrative  . None     Family History:  The patient's family history is not on file. He was adopted.   ROS:   Please see the history of present illness.    ROS All other systems reviewed and are negative.   PHYSICAL EXAM:   VS:  BP 112/70 (BP Location: Left Arm, Patient Position: Sitting, Cuff Size: Normal)   Pulse 62   Ht 5\' 10"  (1.778 m)   Wt 218 lb 2 oz (98.9 kg)   BMI 31.30 kg/m    GEN: Well nourished, well developed, in no acute distress  HEENT: normal  Neck: no JVD, carotid bruits, or masses Cardiac: RRR; no murmurs, rubs, or gallops,no  edema  Respiratory:  clear to auscultation bilaterally, normal work of breathing GI: soft, nontender, nondistended, + BS MS: no deformity or atrophy  Skin: warm and dry, no rash Neuro:  Alert and Oriented x 3, Strength and sensation are intact Psych: euthymic mood, full affect  Wt Readings from Last 3 Encounters:  11/21/15 218 lb 2 oz (98.9 kg)  11/15/14 222 lb (100.7 kg)  11/04/13 223 lb 9.6 oz (101.4 kg)      Studies/Labs Reviewed:   EKG:  EKG is ordered today.  The ekg ordered today demonstrates Sinus rhythm, normal tracing, QTC 410 ms  Recent Labs: 11/21/2015: ALT 32; BUN 14; Creat 0.88; Hemoglobin 15.1; Platelets 243; Potassium 5.4; Sodium 142   Lipid Panel    Component Value Date/Time   CHOL 98 (L) 11/21/2015 1045    TRIG 74 11/21/2015 1045   HDL 39 (L) 11/21/2015 1045   CHOLHDL 2.5 11/21/2015 1045   VLDL 15 11/21/2015 1045   LDLCALC 44 11/21/2015 1045     ASSESSMENT:    1. Coronary artery disease involving native coronary artery of native heart without angina pectoris   2. Dyslipidemia   3. Medication management   4. Urinary frequency      PLAN:  In order of problems listed above:  1. CAD: Asymptomatic. Remote stent to OM more than 10 years ago. Focus on risk factor management. 2. HLP: Satisfactory lipid profile year ago, time to recheck. Since we are ordering labs he requests that we also checked a PSA that he would normally have at his follow-up appointment with Dr. Gerarda Fraction. Strongly encouraged his efforts to lose weight. He is still in a mildly obese range. Discussed healthy diet and exercise habits.    Medication Adjustments/Labs and Tests Ordered: Current medicines are reviewed at length with the patient today.  Concerns regarding medicines are outlined above.  Medication changes, Labs and Tests ordered today are listed in the Patient Instructions below. Patient Instructions  Medication Instructions: Dr Sallyanne Kuster recommends that you continue on your current medications as directed. Please refer to the Current Medication list given to you today.  Labwork: Your physician recommends that you return for lab work at your earliest [place].  Testing/Procedures: NONE ORDERED  Follow-up: Dr Sallyanne Kuster recommends that you schedule a follow-up appointment in 12 months. You will receive a reminder letter in the mail two months in advance. If you don't receive a letter, please call our office to schedule the follow-up appointment.  If you need a refill on your cardiac medications before your next appointment, please call your pharmacy.    Signed, Sanda Klein, MD  11/22/2015 6:23 PM    [address], [place], [place]  [address] Phone: [phone]; Fax: [phone]

## 2015-11-22 LAB — CBC
HEMATOCRIT: 44.5 % (ref 38.5–50.0)
Hemoglobin: 15.1 g/dL (ref 13.2–17.1)
MCH: 30.1 pg (ref 27.0–33.0)
MCHC: 33.9 g/dL (ref 32.0–36.0)
MCV: 88.8 fL (ref 80.0–100.0)
MPV: 10.1 fL (ref 7.5–12.5)
Platelets: 243 10*3/uL (ref 140–400)
RBC: 5.01 MIL/uL (ref 4.20–5.80)
RDW: 13.3 % (ref 11.0–15.0)
WBC: 7.1 10*3/uL (ref 3.8–10.8)

## 2015-11-22 LAB — LIPID PANEL
Cholesterol: 98 mg/dL — ABNORMAL LOW (ref 125–200)
HDL: 39 mg/dL — AB (ref >=40)
LDL CALC: 44 mg/dL (ref <130)
Total CHOL/HDL Ratio: 2.5 Ratio (ref <=5.0)
Triglycerides: 74 mg/dL (ref <150)
VLDL: 15 mg/dL (ref <30)

## 2015-11-22 LAB — COMPREHENSIVE METABOLIC PANEL
ALT: 32 U/L (ref 9–46)
AST: 28 U/L (ref 10–35)
Albumin: 4.4 g/dL (ref 3.6–5.1)
Alkaline Phosphatase: 74 U/L (ref 40–115)
BILIRUBIN TOTAL: 1.3 mg/dL — AB (ref 0.2–1.2)
BUN: 14 mg/dL (ref 7–25)
CALCIUM: 9.7 mg/dL (ref 8.6–10.3)
CHLORIDE: 104 mmol/L (ref 98–110)
CO2: 27 mmol/L (ref 20–31)
Creat: 0.88 mg/dL (ref 0.70–1.25)
GLUCOSE: 93 mg/dL (ref 65–99)
Potassium: 5.4 mmol/L — ABNORMAL HIGH (ref 3.5–5.3)
Sodium: 142 mmol/L (ref 135–146)
Total Protein: 6.9 g/dL (ref 6.1–8.1)

## 2015-11-22 LAB — PSA: PSA: 1.1 ng/mL (ref <=4.0)

## 2015-12-06 ENCOUNTER — Other Ambulatory Visit: Payer: Self-pay | Admitting: *Deleted

## 2015-12-06 MED ORDER — LISINOPRIL 5 MG PO TABS: 5.0000 mg | ORAL_TABLET | Freq: Every day | ORAL | 3 refills | Status: DC

## 2015-12-06 MED ORDER — SIMVASTATIN 20 MG PO TABS: 20.0000 mg | ORAL_TABLET | Freq: Every day | ORAL | 3 refills | Status: DC

## 2015-12-28 DIAGNOSIS — Z23 Encounter for immunization: Secondary | ICD-10-CM | POA: Diagnosis not present

## 2015-12-28 DIAGNOSIS — Z6832 Body mass index (BMI) 32.0-32.9, adult: Secondary | ICD-10-CM | POA: Diagnosis not present

## 2015-12-28 DIAGNOSIS — I10 Essential (primary) hypertension: Secondary | ICD-10-CM | POA: Diagnosis not present

## 2015-12-28 DIAGNOSIS — I2510 Atherosclerotic heart disease of native coronary artery without angina pectoris: Secondary | ICD-10-CM | POA: Diagnosis not present

## 2015-12-28 DIAGNOSIS — E782 Mixed hyperlipidemia: Secondary | ICD-10-CM | POA: Diagnosis not present

## 2015-12-28 DIAGNOSIS — Z0001 Encounter for general adult medical examination with abnormal findings: Secondary | ICD-10-CM | POA: Diagnosis not present

## 2016-07-23 DIAGNOSIS — X32XXXD Exposure to sunlight, subsequent encounter: Secondary | ICD-10-CM | POA: Diagnosis not present

## 2016-07-23 DIAGNOSIS — L570 Actinic keratosis: Secondary | ICD-10-CM | POA: Diagnosis not present

## 2016-07-23 DIAGNOSIS — Z85828 Personal history of other malignant neoplasm of skin: Secondary | ICD-10-CM | POA: Diagnosis not present

## 2016-07-23 DIAGNOSIS — Z08 Encounter for follow-up examination after completed treatment for malignant neoplasm: Secondary | ICD-10-CM | POA: Diagnosis not present

## 2016-07-23 DIAGNOSIS — Z1283 Encounter for screening for malignant neoplasm of skin: Secondary | ICD-10-CM | POA: Diagnosis not present

## 2016-09-03 DIAGNOSIS — E669 Obesity, unspecified: Secondary | ICD-10-CM | POA: Diagnosis not present

## 2016-09-03 DIAGNOSIS — Z1389 Encounter for screening for other disorder: Secondary | ICD-10-CM | POA: Diagnosis not present

## 2016-09-03 DIAGNOSIS — I2510 Atherosclerotic heart disease of native coronary artery without angina pectoris: Secondary | ICD-10-CM | POA: Diagnosis not present

## 2016-09-03 DIAGNOSIS — Z6832 Body mass index (BMI) 32.0-32.9, adult: Secondary | ICD-10-CM | POA: Diagnosis not present

## 2016-09-03 DIAGNOSIS — J329 Chronic sinusitis, unspecified: Secondary | ICD-10-CM | POA: Diagnosis not present

## 2016-12-13 ENCOUNTER — Ambulatory Visit (INDEPENDENT_AMBULATORY_CARE_PROVIDER_SITE_OTHER): Payer: Medicare Other | Admitting: Cardiovascular Disease

## 2016-12-13 ENCOUNTER — Encounter: Payer: Self-pay | Admitting: Cardiovascular Disease

## 2016-12-13 VITALS — BP 124/76 | HR 69 | Ht 70.0 in | Wt 218.0 lb

## 2016-12-13 DIAGNOSIS — E669 Obesity, unspecified: Secondary | ICD-10-CM | POA: Diagnosis not present

## 2016-12-13 DIAGNOSIS — E785 Hyperlipidemia, unspecified: Secondary | ICD-10-CM | POA: Diagnosis not present

## 2016-12-13 DIAGNOSIS — I2510 Atherosclerotic heart disease of native coronary artery without angina pectoris: Secondary | ICD-10-CM | POA: Diagnosis not present

## 2016-12-13 DIAGNOSIS — Z79899 Other long term (current) drug therapy: Secondary | ICD-10-CM | POA: Diagnosis not present

## 2016-12-13 DIAGNOSIS — R350 Frequency of micturition: Secondary | ICD-10-CM

## 2016-12-13 DIAGNOSIS — I10 Essential (primary) hypertension: Secondary | ICD-10-CM | POA: Diagnosis not present

## 2016-12-13 LAB — CBC
HEMATOCRIT: 44.4 % (ref 37.5–51.0)
HEMOGLOBIN: 15.3 g/dL (ref 13.0–17.7)
MCH: 30.7 pg (ref 26.6–33.0)
MCHC: 34.5 g/dL (ref 31.5–35.7)
MCV: 89 fL (ref 79–97)
Platelets: 294 10*3/uL (ref 150–379)
RBC: 4.99 x10E6/uL (ref 4.14–5.80)
RDW: 13 % (ref 12.3–15.4)
WBC: 8.7 10*3/uL (ref 3.4–10.8)

## 2016-12-13 LAB — COMPREHENSIVE METABOLIC PANEL
ALT: 29 IU/L (ref 0–44)
AST: 28 IU/L (ref 0–40)
Albumin/Globulin Ratio: 1.7 (ref 1.2–2.2)
Albumin: 4.5 g/dL (ref 3.6–4.8)
Alkaline Phosphatase: 80 IU/L (ref 39–117)
BUN/Creatinine Ratio: 12 (ref 10–24)
BUN: 11 mg/dL (ref 8–27)
Bilirubin Total: 1.3 mg/dL — ABNORMAL HIGH (ref 0.0–1.2)
CALCIUM: 9.9 mg/dL (ref 8.6–10.2)
CO2: 26 mmol/L (ref 20–29)
Chloride: 100 mmol/L (ref 96–106)
Creatinine, Ser: 0.95 mg/dL (ref 0.76–1.27)
GFR calc Af Amer: 96 mL/min/{1.73_m2} (ref >59)
GFR, EST NON AFRICAN AMERICAN: 83 mL/min/{1.73_m2} (ref >59)
GLOBULIN, TOTAL: 2.6 g/dL (ref 1.5–4.5)
Glucose: 86 mg/dL (ref 65–99)
Potassium: 5 mmol/L (ref 3.5–5.2)
SODIUM: 140 mmol/L (ref 134–144)
Total Protein: 7.1 g/dL (ref 6.0–8.5)

## 2016-12-13 LAB — LIPID PANEL
CHOL/HDL RATIO: 2.9 ratio (ref 0.0–5.0)
Cholesterol, Total: 128 mg/dL (ref 100–199)
HDL: 44 mg/dL (ref >39)
LDL CALC: 70 mg/dL (ref 0–99)
TRIGLYCERIDES: 71 mg/dL (ref 0–149)
VLDL CHOLESTEROL CAL: 14 mg/dL (ref 5–40)

## 2016-12-13 LAB — PSA: PROSTATE SPECIFIC AG, SERUM: 1.5 ng/mL (ref 0.0–4.0)

## 2016-12-13 NOTE — Progress Notes (Signed)
Cardiology Office Note    Date:  12/13/2016   ID:  Christopher Obrien, DOB 04/17/1950, MRN [id number]  PCP:  Redmond School, MD  Cardiologist:   Sanda Klein, MD   No chief complaint on file.   History of Present Illness:  Christopher Obrien is a 66 y.o. male with hyperlipidemia hypertension and a remote history of non-STEMI and stent to oblique marginal artery in 2005, returning for routine follow-up.   Try to follow very healthy lifestyle.  He goes to the gym at least 3 usually for 5 days a week.  He uses the weight machines and also walks and jogs on the treadmill.  He eats a healthy diet that is low red meat and vegetables.  Despite this he is frustrated with his inability to lose more weight.  He is a couple pounds lighter than last year but remains in a mildly obese range of the waistline of about 38 inches.  He does feel like his pants are little looser.  The patient specifically denies any chest pain at rest exertion, dyspnea at rest or with exertion, orthopnea, paroxysmal nocturnal dyspnea, syncope, palpitations, focal neurological deficits, intermittent claudication, lower extremity edema, unexplained weight gain, cough, hemoptysis or wheezing.  Past Medical History:  Diagnosis Date  . CAD (coronary artery disease)   . Hyperlipidemia   . NSTEMI (non-ST elevated myocardial infarction) ([place]) 2005  . Systemic hypertension     Past Surgical History:  Procedure Laterality Date  . APPENDECTOMY  1995  . COLONOSCOPY N/A 01/12/2013   Procedure: COLONOSCOPY;  Surgeon: Danie Binder, MD;  [facility];  Service: Endoscopy;  Laterality: N/A;  10:30   . CORONARY ANGIOPLASTY WITH STENT PLACEMENT  07/09/2003   bare metal stent to the first oblique marginal artery  . NM MYOCAR PERF WALL MOTION  03/15/2008   normal    Current Medications: Outpatient Medications Prior to Visit  Medication Sig Dispense Refill  . aspirin 81 MG tablet Take 81 mg by mouth daily.    . Coenzyme Q10 (CO  Q-10) 100 MG CAPS Take 200 mg daily by mouth.     . EPINEPHrine 0.3 mg/0.3 mL IJ SOAJ injection Inject 0.3 mLs (0.3 mg total) into the muscle once. (Patient taking differently: Inject 0.3 mg into the muscle once as needed (anaphylaxis). ) 1 Device 1  . lisinopril (PRINIVIL,ZESTRIL) 5 MG tablet Take 1 tablet (5 mg total) by mouth daily. 90 tablet 3  . MULTIPLE VITAMIN PO Take by mouth.    . Omega-3 Fatty Acids (FISH OIL) 1200 MG CAPS Take 1 capsule by mouth daily.    . simvastatin (ZOCOR) 20 MG tablet Take 1 tablet (20 mg total) by mouth at bedtime. 90 tablet 3  . vitamin C (ASCORBIC ACID) 250 MG tablet Take 250 mg by mouth daily.     No facility-administered medications prior to visit.      Allergies:   Bee venom and Penicillins   Social History   Socioeconomic History  . Marital status: Married    Spouse name: None  . Number of children: None  . Years of education: None  . Highest education level: None  Social Needs  . Financial resource strain: None  . Food insecurity - worry: None  . Food insecurity - inability: None  . Transportation needs - medical: None  . Transportation needs - non-medical: None  Occupational History  . None  Tobacco Use  . Smoking status: Never Smoker  . Smokeless  tobacco: Never Used  Substance and Sexual Activity  . Alcohol use: No  . Drug use: No  . Sexual activity: None  Other Topics Concern  . None  Social History Narrative  . None     Family History:  The patient's family history is not on file. He was adopted.   ROS:   Please see the history of present illness.    ROS All other systems reviewed and are negative.   PHYSICAL EXAM:   VS:  BP 124/76   Pulse 69   Ht 5\' 10"  (1.778 m)   Wt 218 lb (98.9 kg)   BMI 31.28 kg/m     General: Alert, oriented x3, no distress, mildly obese Head: no evidence of trauma, PERRL, EOMI, no exophtalmos or lid lag, no myxedema, no xanthelasma; normal ears, nose and oropharynx Neck: normal jugular  venous pulsations and no hepatojugular reflux; brisk carotid pulses without delay and no carotid bruits Chest: clear to auscultation, no signs of consolidation by percussion or palpation, normal fremitus, symmetrical and full respiratory excursions Cardiovascular: normal position and quality of the apical impulse, regular rhythm, normal first and second heart sounds, no murmurs, rubs or gallops Abdomen: no tenderness or distention, no masses by palpation, no abnormal pulsatility or arterial bruits, normal bowel sounds, no hepatosplenomegaly Extremities: no clubbing, cyanosis or edema; 2+ radial, ulnar and brachial pulses bilaterally; 2+ right femoral, posterior tibial and dorsalis pedis pulses; 2+ left femoral, posterior tibial and dorsalis pedis pulses; no subclavian or femoral bruits Neurological: grossly nonfocal Psych: Normal mood and affect    Wt Readings from Last 3 Encounters:  12/13/16 218 lb (98.9 kg)  11/21/15 218 lb 2 oz (98.9 kg)  11/15/14 222 lb (100.7 kg)      Studies/Labs Reviewed:   EKG:  EKG is ordered today.  The ekg ordered today demonstrates normal sinus rhythm, normal tracing  Lipid Panel    Component Value Date/Time   CHOL 98 (L) 11/21/2015 1045   TRIG 74 11/21/2015 1045   HDL 39 (L) 11/21/2015 1045   CHOLHDL 2.5 11/21/2015 1045   VLDL 15 11/21/2015 1045   LDLCALC 44 11/21/2015 1045     ASSESSMENT:    1. Coronary artery disease involving native coronary artery of native heart without angina pectoris   2. Medication management   3. Dyslipidemia   4. Urinary frequency      PLAN:  In order of problems listed above:  1. CAD: Angina free. Remote stent to OM over 10 years ago, no problem since. Focus on risk factor management. 2. HLP: Excellent lipid profile year ago.  Continue to encourage weight loss; he is still in a mildly obese range. Discussed healthy diet and exercise habits. 3. HTN: Controlled. 4. Obesity: congratulated on his healthy lifestyle,  which will need to benefit even if he does not lose more weight 5. Check PSA with blood loss draw;  has his physical with his primary care physician in January.     Medication Adjustments/Labs and Tests Ordered: Current medicines are reviewed at length with the patient today.  Concerns regarding medicines are outlined above.  Medication changes, Labs and Tests ordered today are listed in the Patient Instructions below. Patient Instructions  Dr Sallyanne Kuster recommends that you continue on your current medications as directed. Please refer to the Current Medication list given to you today.  Your physician recommends that you return for lab work TODAY.  Dr Sallyanne Kuster recommends that you schedule a follow-up appointment in 12 months.  You will receive a reminder letter in the mail two months in advance. If you don't receive a letter, please call our office to schedule the follow-up appointment.  If you need a refill on your cardiac medications before your next appointment, please call your pharmacy.    Signed, Sanda Klein, MD  12/13/2016 8:31 AM    [facility] [address], [place], [place]  [address] Phone: [phone]; Fax: [phone]

## 2016-12-13 NOTE — Patient Instructions (Signed)
Dr Croitoru recommends that you continue on your current medications as directed. Please refer to the Current Medication list given to you today.  Your physician recommends that you return for lab work TODAY.  Dr Croitoru recommends that you schedule a follow-up appointment in 12 months. You will receive a reminder letter in the mail two months in advance. If you don't receive a letter, please call our office to schedule the follow-up appointment.  If you need a refill on your cardiac medications before your next appointment, please call your pharmacy. 

## 2016-12-17 ENCOUNTER — Telehealth: Payer: Self-pay | Admitting: Cardiovascular Disease

## 2016-12-17 MED ORDER — LISINOPRIL 5 MG PO TABS: 5.0000 mg | ORAL_TABLET | Freq: Every day | ORAL | 3 refills | Status: DC

## 2016-12-17 MED ORDER — SIMVASTATIN 20 MG PO TABS: 20.0000 mg | ORAL_TABLET | Freq: Every day | ORAL | 3 refills | Status: DC

## 2016-12-17 NOTE — Telephone Encounter (Signed)
Refill sent to the pharmacy electronically.  

## 2016-12-17 NOTE — Telephone Encounter (Signed)
°*  STAT* If patient is at the pharmacy, call can be transferred to refill team.   1. Which medications need to be refilled? (please list name of each medication and dose if known) Lisinopril and Simvastatin  2. Which pharmacy/location (including street and city if local pharmacy) is medication to be sent to?[facility]\  3. Do they need a 30 day or 90 day supply? 90 and refills

## 2017-02-06 ENCOUNTER — Telehealth: Payer: Self-pay | Admitting: Cardiovascular Disease

## 2017-02-06 MED ORDER — SIMVASTATIN 20 MG PO TABS: 20.0000 mg | ORAL_TABLET | Freq: Every day | ORAL | 3 refills | Status: DC

## 2017-02-06 MED ORDER — LISINOPRIL 5 MG PO TABS: 5.0000 mg | ORAL_TABLET | Freq: Every day | ORAL | 3 refills | Status: DC

## 2017-02-06 NOTE — Telephone Encounter (Signed)
New Message    Needs new prescriptions sent to this pharmacy  *STAT* If patient is at the pharmacy, call can be transferred to refill team.   1. Which medications need to be refilled? (please list name of each medication and dose if known)  simvastatin (ZOCOR) 20 MG tablet Take 1 tablet (20 mg total) at bedtime by mouth.   lisinopril (PRINIVIL,ZESTRIL) 5 MG tablet Take 1 tablet (5 mg total) daily by mouth      2. Which pharmacy/location (including street and city if local pharmacy) is medication to be sent to? [facility]   3. Do they need a 30 day or 90 day supply?  [place]

## 2017-02-11 DIAGNOSIS — H2513 Age-related nuclear cataract, bilateral: Secondary | ICD-10-CM | POA: Diagnosis not present

## 2017-02-11 DIAGNOSIS — H52223 Regular astigmatism, bilateral: Secondary | ICD-10-CM | POA: Diagnosis not present

## 2017-02-11 DIAGNOSIS — H5203 Hypermetropia, bilateral: Secondary | ICD-10-CM | POA: Diagnosis not present

## 2017-02-11 DIAGNOSIS — H524 Presbyopia: Secondary | ICD-10-CM | POA: Diagnosis not present

## 2017-02-12 ENCOUNTER — Telehealth: Payer: Self-pay | Admitting: Cardiovascular Disease

## 2017-02-12 NOTE — Telephone Encounter (Signed)
Follow up    Patient says that he requested a 1 year supply of lisinopril (PRINIVIL,ZESTRIL) 5 MG tablet and simvastatin (ZOCOR) 20 MG tablet And was approved but the pharmacy [facility])said they never received the approval Please call.

## 2017-02-13 MED ORDER — SIMVASTATIN 20 MG PO TABS: 20.0000 mg | ORAL_TABLET | Freq: Every day | ORAL | 3 refills | Status: DC

## 2017-02-13 MED ORDER — LISINOPRIL 5 MG PO TABS: 5.0000 mg | ORAL_TABLET | Freq: Every day | ORAL | 3 refills | Status: DC

## 2017-02-14 ENCOUNTER — Telehealth: Payer: Self-pay | Admitting: Cardiovascular Disease

## 2017-02-14 DIAGNOSIS — I2510 Atherosclerotic heart disease of native coronary artery without angina pectoris: Secondary | ICD-10-CM | POA: Diagnosis not present

## 2017-02-14 DIAGNOSIS — Z6833 Body mass index (BMI) 33.0-33.9, adult: Secondary | ICD-10-CM | POA: Diagnosis not present

## 2017-02-14 DIAGNOSIS — I10 Essential (primary) hypertension: Secondary | ICD-10-CM | POA: Diagnosis not present

## 2017-02-14 DIAGNOSIS — E782 Mixed hyperlipidemia: Secondary | ICD-10-CM | POA: Diagnosis not present

## 2017-02-14 DIAGNOSIS — Z0001 Encounter for general adult medical examination with abnormal findings: Secondary | ICD-10-CM | POA: Diagnosis not present

## 2017-02-14 MED ORDER — LISINOPRIL 5 MG PO TABS: 5.0000 mg | ORAL_TABLET | Freq: Every day | ORAL | 3 refills | Status: DC

## 2017-02-14 MED ORDER — SIMVASTATIN 20 MG PO TABS: 20.0000 mg | ORAL_TABLET | Freq: Every day | ORAL | 3 refills | Status: DC

## 2017-02-14 NOTE — Telephone Encounter (Signed)
°*  STAT* If patient is at the pharmacy, call can be transferred to refill team.   1. Which medications need to be refilled? (please list name of each medication and dose if known) simvastatin (ZOCOR) 20 MG tablet  lisinopril (PRINIVIL,ZESTRIL) 5 MG tablet 2. Which pharmacy/location (including street and city if local pharmacy) is medication to be sent to? [facility] please fax [phone] 3. Do they need a 30 day or 90 day supply?  90 day for a year

## 2017-07-04 ENCOUNTER — Encounter (HOSPITAL_COMMUNITY): Payer: Self-pay | Admitting: Emergency Medicine

## 2017-07-04 ENCOUNTER — Emergency Department (HOSPITAL_COMMUNITY)
Admission: EM | Admit: 2017-07-04 | Discharge: 2017-07-04 | Disposition: A | Discharge status: Home / Self Care | Payer: PPO | Attending: Emergency Medicine | Admitting: Emergency Medicine

## 2017-07-04 ENCOUNTER — Other Ambulatory Visit: Payer: Self-pay

## 2017-07-04 DIAGNOSIS — E785 Hyperlipidemia, unspecified: Secondary | ICD-10-CM | POA: Insufficient documentation

## 2017-07-04 DIAGNOSIS — I2510 Atherosclerotic heart disease of native coronary artery without angina pectoris: Secondary | ICD-10-CM | POA: Diagnosis not present

## 2017-07-04 DIAGNOSIS — S00561A Insect bite (nonvenomous) of lip, initial encounter: Secondary | ICD-10-CM | POA: Diagnosis present

## 2017-07-04 DIAGNOSIS — Y939 Activity, unspecified: Secondary | ICD-10-CM | POA: Insufficient documentation

## 2017-07-04 DIAGNOSIS — T7840XA Allergy, unspecified, initial encounter: Secondary | ICD-10-CM

## 2017-07-04 DIAGNOSIS — W57XXXA Bitten or stung by nonvenomous insect and other nonvenomous arthropods, initial encounter: Secondary | ICD-10-CM | POA: Insufficient documentation

## 2017-07-04 DIAGNOSIS — I252 Old myocardial infarction: Secondary | ICD-10-CM | POA: Diagnosis not present

## 2017-07-04 DIAGNOSIS — Y998 Other external cause status: Secondary | ICD-10-CM | POA: Insufficient documentation

## 2017-07-04 DIAGNOSIS — Z7982 Long term (current) use of aspirin: Secondary | ICD-10-CM | POA: Diagnosis not present

## 2017-07-04 DIAGNOSIS — Z79899 Other long term (current) drug therapy: Secondary | ICD-10-CM | POA: Insufficient documentation

## 2017-07-04 DIAGNOSIS — Y929 Unspecified place or not applicable: Secondary | ICD-10-CM | POA: Diagnosis not present

## 2017-07-04 LAB — I-STAT CHEM 8, ED
BUN: 13 mg/dL (ref 6–20)
CALCIUM ION: 1.17 mmol/L (ref 1.15–1.40)
CHLORIDE: 101 mmol/L (ref 101–111)
Creatinine, Ser: 0.9 mg/dL (ref 0.61–1.24)
Glucose, Bld: 95 mg/dL (ref 65–99)
HEMATOCRIT: 43 % (ref 39.0–52.0)
Hemoglobin: 14.6 g/dL (ref 13.0–17.0)
Potassium: 4.3 mmol/L (ref 3.5–5.1)
SODIUM: 140 mmol/L (ref 135–145)
TCO2: 28 mmol/L (ref 22–32)

## 2017-07-04 MED ORDER — FAMOTIDINE 20 MG PO TABS: 20.0000 mg | ORAL_TABLET | Freq: Two times a day (BID) | ORAL | 0 refills | Status: DC

## 2017-07-04 MED ORDER — SODIUM CHLORIDE 0.9 % IV BOLUS
500.0000 mL | Freq: Once | INTRAVENOUS | Status: AC
  Administered 2017-07-04: 500 mL via INTRAVENOUS

## 2017-07-04 MED ORDER — PREDNISONE 20 MG PO TABS: ORAL_TABLET | ORAL | 0 refills | Status: DC

## 2017-07-04 MED ORDER — FAMOTIDINE IN NACL 20-0.9 MG/50ML-% IV SOLN
20.0000 mg | Freq: Once | INTRAVENOUS | Status: AC
  Administered 2017-07-04: 20 mg via INTRAVENOUS
  Filled 2017-07-04: qty 50

## 2017-07-04 MED ORDER — DIPHENHYDRAMINE HCL 50 MG/ML IJ SOLN
50.0000 mg | Freq: Once | INTRAMUSCULAR | Status: AC
  Administered 2017-07-04: 50 mg via INTRAVENOUS
  Filled 2017-07-04: qty 1

## 2017-07-04 MED ORDER — METHYLPREDNISOLONE SODIUM SUCC 125 MG IJ SOLR
125.0000 mg | Freq: Once | INTRAMUSCULAR | Status: AC
  Administered 2017-07-04: 125 mg via INTRAVENOUS
  Filled 2017-07-04: qty 2

## 2017-07-04 NOTE — Discharge Instructions (Addendum)
Take Benadryl 25 mg every 4-6 hours as needed for swelling or rash.  Return if any problems.

## 2017-07-04 NOTE — ED Provider Notes (Signed)
[facility] EMERGENCY DEPARTMENT Provider Note   CSN: [id number] Arrival date & time: 07/04/17  1423     History   Chief Complaint Chief Complaint  Patient presents with  . Insect Bite    HPI Christopher Obrien is a 67 y.o. male.  Patient was done in the upper limb today by a wasp.  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 No language interpreter was used.  Allergic Reaction  Presenting symptoms: no difficulty breathing and no rash   Severity:  Mild Prior allergic episodes:  Insect allergies Context: not animal exposure   Relieved by:  Nothing Worsened by:  Nothing Ineffective treatments:  None tried   Past Medical History:  Diagnosis Date  . CAD (coronary artery disease)   . Hyperlipidemia   . NSTEMI (non-ST elevated myocardial infarction) ([place]) 2005  . Systemic hypertension     Patient Active Problem List   Diagnosis Date Noted  . CAD - NSTEMI 2005, bare metal stent OM1 11/05/2012  . Hyperlipidemia 11/05/2012  . HTN (hypertension) 11/05/2012  . Obesity (BMI 30.0-34.9) 11/05/2012    Past Surgical History:  Procedure Laterality Date  . APPENDECTOMY  1995  . COLONOSCOPY N/A 01/12/2013   Procedure: COLONOSCOPY;  Surgeon: Danie Binder, MD;  [facility]: [facility];  Service: Endoscopy;  Laterality: N/A;  10:30   . CORONARY ANGIOPLASTY WITH STENT PLACEMENT  07/09/2003   bare metal stent to the first oblique marginal artery  . NM MYOCAR PERF WALL MOTION  03/15/2008   normal        Home Medications    Prior to Admission medications   Medication Sig Start Date End Date Taking? Authorizing Provider  aspirin 81 MG tablet Take 81 mg by mouth at bedtime.    Yes [provider]  fluticasone (FLONASE) 50 MCG/ACT nasal spray Place 1-2 sprays into both nostrils daily as needed for allergies or rhinitis.   Yes [provider]  lisinopril (PRINIVIL,ZESTRIL) 5 MG tablet Take 1 tablet (5 mg total) by mouth  daily. Patient taking differently: Take 5 mg by mouth at bedtime.  02/14/17  Yes Croitoru, Mihai, MD  Omega-3 Fatty Acids (EQL OMEGA 3 FISH OIL) 1400 MG CAPS Take 1 capsule by mouth every morning.   Yes [provider]  simvastatin (ZOCOR) 20 MG tablet Take 1 tablet (20 mg total) by mouth at bedtime. 02/14/17  Yes Croitoru, Mihai, MD  famotidine (PEPCID) 20 MG tablet Take 1 tablet (20 mg total) by mouth 2 (two) times daily. 07/04/17   Milton Ferguson, MD  predniSONE (DELTASONE) 20 MG tablet 2 tabs po daily x 3 days 07/04/17   Milton Ferguson, MD    Family History Family History  Adopted: Yes    Social History Social History   Tobacco Use  . Smoking status: Never Smoker  . Smokeless tobacco: Never Used  Substance Use Topics  . Alcohol use: No  . Drug use: No     Allergies   Bee venom and Penicillins   Review of Systems Review of Systems  Constitutional: Negative for appetite change and fatigue.  HENT: Negative for congestion, ear discharge and sinus pressure.        Willing to upper lip  Eyes: Negative for discharge.  Respiratory: Negative for cough.   Cardiovascular: Negative for chest pain.  Gastrointestinal: Negative for abdominal pain and diarrhea.  Genitourinary: Negative for frequency and hematuria.  Musculoskeletal: Negative for back pain.  Skin: Negative for rash.  Neurological: Negative for seizures and headaches.  Psychiatric/Behavioral: Negative for hallucinations.     Physical Exam Updated Vital Signs BP (!) 152/82 (BP Location: Left Arm)   Pulse 77   Temp 97.8 F (36.6 C) (Oral)   Resp 17   SpO2 98%   Physical Exam  Constitutional: He is oriented to person, place, and time. He appears well-developed.  HENT:  Head: Normocephalic.  Moderate swelling upper lip  Eyes: Conjunctivae and EOM are normal. No scleral icterus.  Neck: Neck supple. No thyromegaly present.  Cardiovascular: Normal rate and regular rhythm. Exam reveals no gallop and no  friction rub.  No murmur heard. Pulmonary/Chest: No stridor. He has no wheezes. He has no rales. He exhibits no tenderness.  Abdominal: He exhibits no distension. There is no tenderness. There is no rebound.  Musculoskeletal: Normal range of motion. He exhibits no edema.  Lymphadenopathy:    He has no cervical adenopathy.  Neurological: He is oriented to person, place, and time. He exhibits normal muscle tone. Coordination normal.  Skin: No rash noted. No erythema.  Psychiatric: He has a normal mood and affect. His behavior is normal.     ED Treatments / Results  Labs (all labs ordered are listed, but only abnormal results are displayed) Labs Reviewed  I-STAT CHEM 8, ED    EKG None  Radiology No results found.  Procedures Procedures (including critical care time)  Medications Ordered in ED Medications  sodium chloride 0.9 % bolus 500 mL (0 mLs Intravenous Stopped 07/04/17 1541)  methylPREDNISolone sodium succinate (SOLU-MEDROL) 125 mg/2 mL injection 125 mg (125 mg Intravenous Given 07/04/17 1501)  famotidine (PEPCID) IVPB 20 mg premix (0 mg Intravenous Stopped 07/04/17 1541)  diphenhydrAMINE (BENADRYL) injection 50 mg (50 mg Intravenous Given 07/04/17 1501)     Initial Impression / Assessment and Plan / ED Course  I have reviewed the triage vital signs and the nursing notes.  Pertinent labs & imaging results that were available during my care of the patient were reviewed by me and considered in my medical decision making (see chart for details).     Patient with insect sting to his upper lip with moderate amount of swelling.  Patient has a history of anaphylaxis but he only had a local reaction this time.  Patient was given steroids Benadryl and Pepcid and his symptoms improved.  He will follow-up as needed and be discharged with prednisone Pepcid and Benadryl  Final Clinical Impressions(s) / ED Diagnoses   Final diagnoses:  Allergic reaction, initial encounter    ED  Discharge Orders        Ordered    famotidine (PEPCID) 20 MG tablet  2 times daily     07/04/17 1719    predniSONE (DELTASONE) 20 MG tablet     07/04/17 1719       Milton Ferguson, MD 07/04/17 1722

## 2017-07-04 NOTE — ED Triage Notes (Signed)
Stung by wasp to upper lip. Upper lip swellng and redness noted. No pta meds. Denies trouble swallowing or swelling of throat.

## 2017-09-02 ENCOUNTER — Other Ambulatory Visit: Payer: Self-pay | Admitting: *Deleted

## 2017-09-02 NOTE — Patient Outreach (Signed)
Telephone call to pt for follow up HTA health risk assessment completed, spoke with pt, HIPAA verified, pt reports he utilized urgent care in the past for bee, wasp stings and allergic, pt reports he is not allowed to use epi pen because it elevates BP and pt has had cardiac issues in the past.  Pt reports he now has a kit (benadryl, steroids, etc) on hand to use as his action plan and will utilize urgent care/ ED if using the kit and applying ice is not successful.  Pt reports he had "slight heart attack in 2005 and I've done well ever since"  Pt states he exercises 4-5 times weekly at gym through HTA benefit and also walks.  Pt reports he sees Dr. Gerarda Fraction as primary care MD. Pt reports he has no needs for care management, RN CM mailed successful outreach letter to pt home.  Jacqlyn Larsen [facility], [place] Coordinator [phone]

## 2017-09-03 ENCOUNTER — Ambulatory Visit (INDEPENDENT_AMBULATORY_CARE_PROVIDER_SITE_OTHER): Payer: PPO | Admitting: Allergy and Immunology

## 2017-09-03 ENCOUNTER — Encounter: Payer: Self-pay | Admitting: Allergy and Immunology

## 2017-09-03 VITALS — BP 140/90 | HR 70 | Temp 97.7°F | Resp 16 | Ht 67.5 in | Wt 222.0 lb

## 2017-09-03 DIAGNOSIS — T7840XA Allergy, unspecified, initial encounter: Secondary | ICD-10-CM | POA: Insufficient documentation

## 2017-09-03 DIAGNOSIS — T7840XD Allergy, unspecified, subsequent encounter: Secondary | ICD-10-CM

## 2017-09-03 DIAGNOSIS — J3089 Other allergic rhinitis: Secondary | ICD-10-CM | POA: Insufficient documentation

## 2017-09-03 MED ORDER — AZELASTINE HCL 0.15 % NA SOLN: 1.0000 | Freq: Two times a day (BID) | NASAL | 5 refills | Status: DC | PRN

## 2017-09-03 MED ORDER — AUVI-Q 0.3 MG/0.3ML IJ SOAJ: 0.3000 mg | Freq: Once | INTRAMUSCULAR | 1 refills | Status: AC

## 2017-09-03 NOTE — Progress Notes (Signed)
New Patient Note  RE: Christopher Obrien MRN: [id number] DOB: [birth year] Date of Office Visit: 09/03/2017  Referring provider: Redmond School, MD Primary care provider: Redmond School, MD  Chief Complaint: Allergic Reaction and Nasal Congestion   History of present illness: Christopher Obrien is a 67 y.o. male seen today in consultation requested by Redmond School, MD.  He reports that approximately 2 years ago he was stung by what he believes to have been a hornet on his ear.  Over the course of the next hour he developed generalized urticaria.  He did not experience concomitant cardiopulmonary or GI symptoms.  He went to the emergency department and was treated with intravenous diphenhydramine and corticosteroid.  He is not certain if he was treated with epinephrine.  In early June 2019, he was stung on the lip by what he believes to have been a yellow jacket.  He developed lip and facial swelling, however no concomitant urticaria, cardiopulmonary symptoms, or GI symptoms.  He went to the local emergency department and was treated with diphenhydramine and prednisone.  He has been given a prescription for an epinephrine autoinjector in the past, however did not have this prescription filled. He experiences nasal congestion, thick postnasal drainage, ear congestion/pressure, and occasional sinus pressure.  These symptoms occur year-round but are more frequent and severe in the springtime in the fall.  He attempts to control the symptoms with fluticasone nasal spray.  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  Laboratory order form has been provided for baseline tryptase level and serum specific IgE against hymenoptera panel.  Continue careful avoidance of flying insects.  A prescription has been provided for epinephrine 0.3 mg autoinjector 2 pack along with instructions for its proper administration.  Perennial and  seasonal allergic rhinitis  Aeroallergen avoidance measures have been discussed and provided in written form.  A prescription has been provided for azelastine nasal spray, 1-2 sprays per nostril 2 times daily as needed. Proper nasal spray technique has been discussed and demonstrated.   Nasal saline spray (i.e., Simply Saline) or nasal saline lavage (i.e., NeilMed) is recommended as needed and prior to medicated nasal sprays.  For thick post nasal drainage, add guaifenesin [phone] mg (Mucinex)  twice daily as needed with adequate hydration as discussed.   Meds ordered this encounter  Medications  . AUVI-Q 0.3 MG/0.3ML SOAJ injection    Sig: Inject 0.3 mLs (0.3 mg total) into the muscle once for 1 dose.    Dispense:  4 Device    Refill:  1  . Azelastine HCl 0.15 % SOLN    Sig: Place 1-2 sprays into both nostrils 2 (two) times daily as needed.    Dispense:  30 mL    Refill:  5    Diagnostics: Epicutaneous testing: Positive to tree pollen. Intradermal testing: Positive to molds, cat hair, dog epithelia, and dust mite antigen.    Physical examination: Blood pressure 140/90, pulse 70, temperature 97.7 F (36.5 C), resp. rate 16, height 5' 7.5" (1.715 m), weight 222 lb (100.7 kg), SpO2 98 %.  General: Alert, interactive, in no acute distress. HEENT: TMs pearly gray, turbinates edematous without discharge, post-pharynx moderately erythematous. Neck: Supple without lymphadenopathy. Lungs: Clear to auscultation without wheezing, rhonchi or rales. CV: Normal S1, S2 without murmurs. Abdomen: Nondistended, nontender. Skin: Warm and dry, without lesions or rashes. Extremities:  No clubbing, cyanosis or edema. Neuro:   Grossly intact.  Review of systems:  Review of systems negative except as noted in HPI / PMHx or noted below: Review of Systems  Constitutional: Negative.   HENT: Negative.   Eyes: Negative.   Respiratory: Negative.   Cardiovascular: Negative.   Gastrointestinal:  Negative.   Genitourinary: Negative.   Musculoskeletal: Negative.   Skin: Negative.   Neurological: Negative.   Endo/Heme/Allergies: Negative.   Psychiatric/Behavioral: Negative.     Past medical history:  Past Medical History:  Diagnosis Date  . CAD (coronary artery disease)   . Hyperlipidemia   . NSTEMI (non-ST elevated myocardial infarction) ([place]) 2005  . Systemic hypertension     Past surgical history:  Past Surgical History:  Procedure Laterality Date  . APPENDECTOMY  1995  . COLONOSCOPY N/A 01/12/2013   Procedure: COLONOSCOPY;  Surgeon: Danie Binder, MD;  Location: [facility];  Service: Endoscopy;  Laterality: N/A;  10:30   . CORONARY ANGIOPLASTY WITH STENT PLACEMENT  07/09/2003   bare metal stent to the first oblique marginal artery  . NM MYOCAR PERF WALL MOTION  03/15/2008   normal    Family history: Family History  Adopted: Yes    Social history: Social History   Socioeconomic History  . Marital status: Married    Spouse name: Not on file  . Number of children: Not on file  . Years of education: Not on file  . Highest education level: Not on file  Occupational History  . Not on file  Social Needs  . Financial resource strain: Not on file  . Food insecurity:    Worry: Not on file    Inability: Not on file  . Transportation needs:    Medical: Not on file    Non-medical: Not on file  Tobacco Use  . Smoking status: Never Smoker  . Smokeless tobacco: Never Used  Substance and Sexual Activity  . Alcohol use: No  . Drug use: No  . Sexual activity: Not on file  Lifestyle  . Physical activity:    Days per week: Not on file    Minutes per session: Not on file  . Stress: Not on file  Relationships  . Social connections:    Talks on phone: Not on file    Gets together: Not on file    Attends religious service: Not on file    Active member of club or organization: Not on file    Attends meetings of clubs or organizations: Not on file     Relationship status: Not on file  . Intimate partner violence:    Fear of current or ex partner: Not on file    Emotionally abused: Not on file    Physically abused: Not on file    Forced sexual activity: Not on file  Other Topics Concern  . Not on file  Social History Narrative  . Not on file   Environmental History: The patient lives in a 67 year old house with carpeting the bedroom, gas heat, and central air.  There is a dog in the house which does not have access to his bedroom.  There is no known mold/water damage in the home.  He is a non-smoker.  Allergies as of 09/03/2017      Reactions   Bee Venom Hives, Itching, Swelling   Swelling to lips, eyes, ears, and possibly throat   Penicillins Rash   Has patient had a PCN reaction causing immediate rash, facial/tongue/throat swelling, SOB or lightheadedness with hypotension: No Has patient had a PCN reaction causing severe rash  involving mucus membranes or skin necrosis: No Has patient had a PCN reaction that required hospitalization: No Has patient had a PCN reaction occurring within the last 10 years: No If all of the above answers are "NO", then may proceed with Cephalosporin use.      Medication List        Accurate as of 09/03/17  6:31 PM. Always use your most recent med list.          aspirin 81 MG tablet Take 81 mg by mouth at bedtime.   AUVI-Q 0.3 mg/0.3 mL Soaj injection Generic drug:  EPINEPHrine Inject 0.3 mLs (0.3 mg total) into the muscle once for 1 dose.   Azelastine HCl 0.15 % Soln Place 1-2 sprays into both nostrils 2 (two) times daily as needed.   EQL OMEGA 3 FISH OIL 1400 MG Caps Take 1 capsule by mouth every morning.   famotidine 20 MG tablet Commonly known as:  PEPCID Take 1 tablet (20 mg total) by mouth 2 (two) times daily.   fluticasone 50 MCG/ACT nasal spray Commonly known as:  FLONASE Place 1-2 sprays into both nostrils daily as needed for allergies or rhinitis.   lisinopril 5 MG  tablet Commonly known as:  PRINIVIL,ZESTRIL Take 1 tablet (5 mg total) by mouth daily.   predniSONE 20 MG tablet Commonly known as:  DELTASONE 2 tabs po daily x 3 days   simvastatin 20 MG tablet Commonly known as:  ZOCOR Take 1 tablet (20 mg total) by mouth at bedtime.       Known medication allergies: Allergies  Allergen Reactions  . Bee Venom Hives, Itching and Swelling    Swelling to lips, eyes, ears, and possibly throat  . Penicillins Rash    Has patient had a PCN reaction causing immediate rash, facial/tongue/throat swelling, SOB or lightheadedness with hypotension: No Has patient had a PCN reaction causing severe rash involving mucus membranes or skin necrosis: No Has patient had a PCN reaction that required hospitalization: No Has patient had a PCN reaction occurring within the last 10 years: No If all of the above answers are "NO", then may proceed with Cephalosporin use.     I appreciate the opportunity to take part in Kaydn's care. Please do not hesitate to contact me with questions.  Sincerely,   R. Edgar Frisk, MD

## 2017-09-03 NOTE — Patient Instructions (Addendum)
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  Laboratory order form has been provided for baseline tryptase level and serum specific IgE against hymenoptera panel.  Continue careful avoidance of flying insects.  A prescription has been provided for epinephrine 0.3 mg autoinjector 2 pack along with instructions for its proper administration.  Perennial and seasonal allergic rhinitis  Aeroallergen avoidance measures have been discussed and provided in written form.  A prescription has been provided for azelastine nasal spray, 1-2 sprays per nostril 2 times daily as needed. Proper nasal spray technique has been discussed and demonstrated.   Nasal saline spray (i.e., Simply Saline) or nasal saline lavage (i.e., NeilMed) is recommended as needed and prior to medicated nasal sprays.  For thick post nasal drainage, add guaifenesin [phone] mg (Mucinex)  twice daily as needed with adequate hydration as discussed.   When lab results have returned the patient will be called with further recommendations and follow up instructions.  Reducing Pollen Exposure  The American Academy of Allergy, Asthma and Immunology suggests the following steps to reduce your exposure to pollen during allergy seasons.    1. Do not hang sheets or clothing out to dry; pollen may collect on these items. 2. Do not mow lawns or spend time around freshly cut grass; mowing stirs up pollen. 3. Keep windows closed at night.  Keep car windows closed while driving. 4. Minimize morning activities outdoors, a time when pollen counts are usually at their highest. 5. Stay indoors as much as possible when pollen counts or humidity is high and on windy days when pollen tends to remain in the air longer. 6. Use air conditioning when possible.  Many air conditioners have filters that trap the pollen spores. 7. Use a HEPA room air filter to remove pollen form the indoor air you  breathe.   Control of House Dust Mite Allergen  House dust mites play a major role in allergic asthma and rhinitis.  They occur in environments with high humidity wherever human skin, the food for dust mites is found. High levels have been detected in dust obtained from mattresses, pillows, carpets, upholstered furniture, bed covers, clothes and soft toys.  The principal allergen of the house dust mite is found in its feces.  A gram of dust may contain 1,000 mites and 250,000 fecal particles.  Mite antigen is easily measured in the air during house cleaning activities.    1. Encase mattresses, including the box spring, and pillow, in an air tight cover.  Seal the zipper end of the encased mattresses with wide adhesive tape. 2. Wash the bedding in water of 130 degrees Farenheit weekly.  Avoid cotton comforters/quilts and flannel bedding: the most ideal bed covering is the dacron comforter. 3. Remove all upholstered furniture from the bedroom. 4. Remove carpets, carpet padding, rugs, and non-washable window drapes from the bedroom.  Wash drapes weekly or use plastic window coverings. 5. Remove all non-washable stuffed toys from the bedroom.  Wash stuffed toys weekly. 6. Have the room cleaned frequently with a vacuum cleaner and a damp dust-mop.  The patient should not be in a room which is being cleaned and should wait 1 hour after cleaning before going into the room. 7. Close and seal all heating outlets in the bedroom.  Otherwise, the room will become filled with dust-laden air.  An electric heater can be used to heat the room. Reduce indoor humidity to less than 50%.  Do not use a humidifier.  Control of Dog or Cat Allergen  Avoidance is the best way to manage a dog or cat allergy. If you have a dog or cat and are allergic to dog or cats, consider removing the dog or cat from the home. If you have a dog or cat but don't want to find it a new home, or if your family wants a pet even though  someone in the household is allergic, here are some strategies that may help keep symptoms at bay:  1. Keep the pet out of your bedroom and restrict it to only a few rooms. Be advised that keeping the dog or cat in only one room will not limit the allergens to that room. 2. Don't pet, hug or kiss the dog or cat; if you do, wash your hands with soap and water. 3. High-efficiency particulate air (HEPA) cleaners run continuously in a bedroom or living room can reduce allergen levels over time. 4. Place electrostatic material sheet in the air inlet vent in the bedroom. 5. Regular use of a high-efficiency vacuum cleaner or a central vacuum can reduce allergen levels. 6. Giving your dog or cat a bath at least once a week can reduce airborne allergen.  Control of Mold Allergen  Mold and fungi can grow on a variety of surfaces provided certain temperature and moisture conditions exist.  Outdoor molds grow on plants, decaying vegetation and soil.  The major outdoor mold, Alternaria and Cladosporium, are found in very high numbers during hot and dry conditions.  Generally, a late Summer - Fall peak is seen for common outdoor fungal spores.  Rain will temporarily lower outdoor mold spore count, but counts rise rapidly when the rainy period ends.  The most important indoor molds are Aspergillus and Penicillium.  Dark, humid and poorly ventilated basements are ideal sites for mold growth.  The next most common sites of mold growth are the bathroom and the kitchen.  Outdoor [employer] 1. Use air conditioning and keep windows closed 2. Avoid exposure to decaying vegetation. 3. Avoid leaf raking. 4. Avoid grain handling. 5. Consider wearing a face mask if working in moldy areas.  Indoor Mold Control 1. Maintain humidity below 50%. 2. Clean washable surfaces with 5% bleach solution. 3. Remove sources e.g. Contaminated carpets.

## 2017-09-03 NOTE — Assessment & Plan Note (Signed)
   Aeroallergen avoidance measures have been discussed and provided in written form.  A prescription has been provided for azelastine nasal spray, 1-2 sprays per nostril 2 times daily as needed. Proper nasal spray technique has been discussed and demonstrated.   Nasal saline spray (i.e., Simply Saline) or nasal saline lavage (i.e., NeilMed) is recommended as needed and prior to medicated nasal sprays.  For thick post nasal drainage, add guaifenesin [phone] mg (Mucinex)  twice daily as needed with adequate hydration as discussed.

## 2017-09-03 NOTE — Assessment & Plan Note (Signed)
The patient's history suggests hymenoptera hypersensitivity.  Laboratory order form has been provided for baseline tryptase level and serum specific IgE against hymenoptera panel.  Continue careful avoidance of flying insects.  A prescription has been provided for epinephrine 0.3 mg autoinjector 2 pack along with instructions for its proper administration.

## 2017-09-06 LAB — ALLERGEN HYMENOPTERA PANEL
Bumblebee: <0.1 kU/L
Honeybee IgE: <0.1 kU/L
Hornet, White Face, IgE: 1.94 kU/L — AB
Hornet, Yellow, IgE: 0.55 kU/L — AB
Paper Wasp IgE: 5.36 kU/L — AB
Yellow Jacket, IgE: 6.19 kU/L — AB

## 2017-09-06 LAB — TRYPTASE: Tryptase: 9.4 ug/L (ref 2.2–13.2)

## 2017-10-03 ENCOUNTER — Ambulatory Visit (INDEPENDENT_AMBULATORY_CARE_PROVIDER_SITE_OTHER): Payer: PPO | Admitting: Otolaryngology

## 2017-10-03 DIAGNOSIS — J343 Hypertrophy of nasal turbinates: Secondary | ICD-10-CM

## 2017-10-03 DIAGNOSIS — H903 Sensorineural hearing loss, bilateral: Secondary | ICD-10-CM | POA: Diagnosis not present

## 2017-10-03 DIAGNOSIS — H6983 Other specified disorders of Eustachian tube, bilateral: Secondary | ICD-10-CM

## 2017-10-03 DIAGNOSIS — J342 Deviated nasal septum: Secondary | ICD-10-CM | POA: Diagnosis not present

## 2017-10-03 DIAGNOSIS — J310 Chronic rhinitis: Secondary | ICD-10-CM

## 2017-10-10 DIAGNOSIS — Z08 Encounter for follow-up examination after completed treatment for malignant neoplasm: Secondary | ICD-10-CM | POA: Diagnosis not present

## 2017-10-10 DIAGNOSIS — Z1283 Encounter for screening for malignant neoplasm of skin: Secondary | ICD-10-CM | POA: Diagnosis not present

## 2017-10-10 DIAGNOSIS — B078 Other viral warts: Secondary | ICD-10-CM | POA: Diagnosis not present

## 2017-10-10 DIAGNOSIS — X32XXXD Exposure to sunlight, subsequent encounter: Secondary | ICD-10-CM | POA: Diagnosis not present

## 2017-10-10 DIAGNOSIS — Z85828 Personal history of other malignant neoplasm of skin: Secondary | ICD-10-CM | POA: Diagnosis not present

## 2017-10-10 DIAGNOSIS — L570 Actinic keratosis: Secondary | ICD-10-CM | POA: Diagnosis not present

## 2017-10-24 ENCOUNTER — Encounter: Payer: Self-pay | Admitting: Cardiovascular Disease

## 2017-10-24 ENCOUNTER — Ambulatory Visit: Payer: PPO | Admitting: Cardiovascular Disease

## 2017-10-24 VITALS — BP 132/72 | HR 64 | Ht 67.5 in | Wt 216.0 lb

## 2017-10-24 DIAGNOSIS — Z79899 Other long term (current) drug therapy: Secondary | ICD-10-CM

## 2017-10-24 DIAGNOSIS — E782 Mixed hyperlipidemia: Secondary | ICD-10-CM | POA: Diagnosis not present

## 2017-10-24 DIAGNOSIS — E785 Hyperlipidemia, unspecified: Secondary | ICD-10-CM

## 2017-10-24 DIAGNOSIS — R5383 Other fatigue: Secondary | ICD-10-CM | POA: Diagnosis not present

## 2017-10-24 DIAGNOSIS — I2510 Atherosclerotic heart disease of native coronary artery without angina pectoris: Secondary | ICD-10-CM | POA: Diagnosis not present

## 2017-10-24 DIAGNOSIS — E669 Obesity, unspecified: Secondary | ICD-10-CM | POA: Diagnosis not present

## 2017-10-24 DIAGNOSIS — I10 Essential (primary) hypertension: Secondary | ICD-10-CM | POA: Diagnosis not present

## 2017-10-24 LAB — CBC
HEMOGLOBIN: 15.5 g/dL (ref 13.0–17.7)
Hematocrit: 42.9 % (ref 37.5–51.0)
MCH: 30.9 pg (ref 26.6–33.0)
MCHC: 36.1 g/dL — ABNORMAL HIGH (ref 31.5–35.7)
MCV: 86 fL (ref 79–97)
Platelets: 269 10*3/uL (ref 150–450)
RBC: 5.02 x10E6/uL (ref 4.14–5.80)
RDW: 12.9 % (ref 12.3–15.4)
WBC: 7.9 10*3/uL (ref 3.4–10.8)

## 2017-10-24 LAB — COMPREHENSIVE METABOLIC PANEL
ALBUMIN: 4.6 g/dL (ref 3.6–4.8)
ALK PHOS: 81 IU/L (ref 39–117)
ALT: 24 IU/L (ref 0–44)
AST: 21 IU/L (ref 0–40)
Albumin/Globulin Ratio: 2.2 (ref 1.2–2.2)
BUN / CREAT RATIO: 14 (ref 10–24)
BUN: 12 mg/dL (ref 8–27)
Bilirubin Total: 1.1 mg/dL (ref 0.0–1.2)
CALCIUM: 9.7 mg/dL (ref 8.6–10.2)
CO2: 24 mmol/L (ref 20–29)
CREATININE: 0.88 mg/dL (ref 0.76–1.27)
Chloride: 102 mmol/L (ref 96–106)
GFR, EST AFRICAN AMERICAN: 103 mL/min/{1.73_m2} (ref >59)
GFR, EST NON AFRICAN AMERICAN: 89 mL/min/{1.73_m2} (ref >59)
GLOBULIN, TOTAL: 2.1 g/dL (ref 1.5–4.5)
Glucose: 86 mg/dL (ref 65–99)
Potassium: 5 mmol/L (ref 3.5–5.2)
SODIUM: 141 mmol/L (ref 134–144)
Total Protein: 6.7 g/dL (ref 6.0–8.5)

## 2017-10-24 LAB — LIPID PANEL
CHOL/HDL RATIO: 2.7 ratio (ref 0.0–5.0)
Cholesterol, Total: 106 mg/dL (ref 100–199)
HDL: 39 mg/dL — ABNORMAL LOW (ref >39)
LDL Calculated: 52 mg/dL (ref 0–99)
Triglycerides: 74 mg/dL (ref 0–149)
VLDL Cholesterol Cal: 15 mg/dL (ref 5–40)

## 2017-10-24 NOTE — Progress Notes (Signed)
Cardiology Office Note    Date:  10/24/2017   ID:  Christopher Obrien, DOB 1950/05/15, MRN [id number]  PCP:  Redmond School, MD  Cardiologist:   Sanda Klein, MD   Chief Complaint  Patient presents with  . Follow-up    pt denied chest pain and SOB    History of Present Illness:  Christopher Obrien is a 67 y.o. male with hyperlipidemia hypertension and a remote history of non-STEMI and stent to oblique marginal artery in 2005, returning for routine follow-up.   He is doing great.  He is exercising at least 5 days a week.  He runs the treadmill at 3.5 miles an hour for 20 minutes, with varying degrees of incline to keep his heart rate around 125 bpm.  He will then use weight machines for another 20 minutes.  He has noticed that his blood pressure has been a little high in the mid 140s.  This is not associated with any complaints and today in the office his blood pressure is quite normal.  Despite his activity level, he is mildly obese with a BMI around 33.  The patient specifically denies any chest pain at rest exertion, dyspnea at rest or with exertion, orthopnea, paroxysmal nocturnal dyspnea, syncope, palpitations, focal neurological deficits, intermittent claudication, lower extremity edema, unexplained weight gain, cough, hemoptysis or wheezing.   Past Medical History:  Diagnosis Date  . CAD (coronary artery disease)   . Hyperlipidemia   . NSTEMI (non-ST elevated myocardial infarction) ([place]) 2005  . Systemic hypertension     Past Surgical History:  Procedure Laterality Date  . APPENDECTOMY  1995  . COLONOSCOPY N/A 01/12/2013   Procedure: COLONOSCOPY;  Surgeon: Danie Binder, MD;  [facility];  Service: Endoscopy;  Laterality: N/A;  10:30   . CORONARY ANGIOPLASTY WITH STENT PLACEMENT  07/09/2003   bare metal stent to the first oblique marginal artery  . NM MYOCAR PERF WALL MOTION  03/15/2008   normal    Current Medications: Outpatient Medications Prior to Visit    Medication Sig Dispense Refill  . aspirin 81 MG tablet Take 81 mg by mouth at bedtime.     . Azelastine HCl 0.15 % SOLN Place 1-2 sprays into both nostrils 2 (two) times daily as needed. 30 mL 5  . lisinopril (PRINIVIL,ZESTRIL) 5 MG tablet Take 1 tablet (5 mg total) by mouth daily. (Patient taking differently: Take 5 mg by mouth at bedtime. ) 90 tablet 3  . simvastatin (ZOCOR) 20 MG tablet Take 1 tablet (20 mg total) by mouth at bedtime. 90 tablet 3  . fluticasone (FLONASE) 50 MCG/ACT nasal spray Place 1-2 sprays into both nostrils daily as needed for allergies or rhinitis.    . famotidine (PEPCID) 20 MG tablet Take 1 tablet (20 mg total) by mouth 2 (two) times daily. (Patient not taking: Reported on 09/03/2017) 10 tablet 0  . Omega-3 Fatty Acids (EQL OMEGA 3 FISH OIL) 1400 MG CAPS Take 1 capsule by mouth every morning.    . predniSONE (DELTASONE) 20 MG tablet 2 tabs po daily x 3 days (Patient not taking: Reported on 09/03/2017) 6 tablet 0   No facility-administered medications prior to visit.      Allergies:   Bee venom and Penicillins   Social History   Socioeconomic History  . Marital status: Married    Spouse name: Not on file  . Number of children: Not on file  . Years of education: Not on file  .  Highest education level: Not on file  Occupational History  . Not on file  Social Needs  . Financial resource strain: Not on file  . Food insecurity:    Worry: Not on file    Inability: Not on file  . Transportation needs:    Medical: Not on file    Non-medical: Not on file  Tobacco Use  . Smoking status: Never Smoker  . Smokeless tobacco: Never Used  Substance and Sexual Activity  . Alcohol use: No  . Drug use: No  . Sexual activity: Not on file  Lifestyle  . Physical activity:    Days per week: Not on file    Minutes per session: Not on file  . Stress: Not on file  Relationships  . Social connections:    Talks on phone: Not on file    Gets together: Not on file     Attends religious service: Not on file    Active member of club or organization: Not on file    Attends meetings of clubs or organizations: Not on file    Relationship status: Not on file  Other Topics Concern  . Not on file  Social History Narrative  . Not on file     Family History:  The patient's family history is not on file. He was adopted.   ROS:   Please see the history of present illness.    ROS All other systems reviewed and are negative.   PHYSICAL EXAM:   VS:  BP 132/72   Pulse 64   Ht 5' 7.5" (1.715 m)   Wt 216 lb (98 kg)   BMI 33.33 kg/m     General: Alert, oriented x3, no distress, mildly obese Head: no evidence of trauma, PERRL, EOMI, no exophtalmos or lid lag, no myxedema, no xanthelasma; normal ears, nose and oropharynx Neck: normal jugular venous pulsations and no hepatojugular reflux; brisk carotid pulses without delay and no carotid bruits Chest: clear to auscultation, no signs of consolidation by percussion or palpation, normal fremitus, symmetrical and full respiratory excursions Cardiovascular: normal position and quality of the apical impulse, regular rhythm, normal first and second heart sounds, no murmurs, rubs or gallops Abdomen: no tenderness or distention, no masses by palpation, no abnormal pulsatility or arterial bruits, normal bowel sounds, no hepatosplenomegaly Extremities: no clubbing, cyanosis or edema; 2+ radial, ulnar and brachial pulses bilaterally; 2+ right femoral, posterior tibial and dorsalis pedis pulses; 2+ left femoral, posterior tibial and dorsalis pedis pulses; no subclavian or femoral bruits Neurological: grossly nonfocal Psych: Normal mood and affect     Wt Readings from Last 3 Encounters:  10/24/17 216 lb (98 kg)  09/03/17 222 lb (100.7 kg)  12/13/16 218 lb (98.9 kg)      Studies/Labs Reviewed:   EKG:  EKG is ordered today.  The ekg ordered today demonstrates sinus rhythm with a single premature beat probably junctional  ectopic, left axis deviation and poor R wave progression, not meeting full criteria for anterior fascicular block  Lipid Panel    Component Value Date/Time   CHOL 128 12/13/2016 0850   TRIG 71 12/13/2016 0850   HDL 44 12/13/2016 0850   CHOLHDL 2.9 12/13/2016 0850   CHOLHDL 2.5 11/21/2015 1045   VLDL 15 11/21/2015 1045   LDLCALC 70 12/13/2016 0850     ASSESSMENT:    1. Coronary artery disease involving native coronary artery of native heart without angina pectoris   2. Dyslipidemia   3. Essential hypertension  4. Mild obesity   5. Other fatigue   6. Medication management      PLAN:  In order of problems listed above:  1. CAD: Asymptomatic, remote stent to OM over 10 years ago, no problem since. Focus on risk factor management. Repeat treadmill stress test. 2. HLP: good lipid profile on statin. 3. HTN: Controlled. 4. Obesity: congratulated on his healthy lifestyle, which will need to benefit even if he does not lose more weight    Medication Adjustments/Labs and Tests Ordered: Current medicines are reviewed at length with the patient today.  Concerns regarding medicines are outlined above.  Medication changes, Labs and Tests ordered today are listed in the Patient Instructions below. Patient Instructions  Medication Instructions: Dr Sallyanne Kuster recommends that you continue on your current medications as directed. Please refer to the Current Medication list given to you today.  Labwork: Your physician recommends that you return for lab work at your convenience - FASTING.  Testing/Procedures: 1. Exercise Tolerance Test - Your physician has requested that you have an exercise tolerance test. For further information please visit [email or web address]. Please also follow instruction sheet, as given.  >> This has been ordered to be completed at [facility]  Follow-up: Dr Sallyanne Kuster recommends that you schedule a follow-up appointment in 12 months. You will receive a  reminder letter in the mail two months in advance. If you don't receive a letter, please call our office to schedule the follow-up appointment.  If you need a refill on your cardiac medications before your next appointment, please call your pharmacy.    Signed, Sanda Klein, MD  10/24/2017 8:22 AM    [facility] [place], [place], [place]  [address] Phone: [phone]; Fax: [phone]

## 2017-10-24 NOTE — Patient Instructions (Addendum)
Medication Instructions: Dr Sallyanne Kuster recommends that you continue on your current medications as directed. Please refer to the Current Medication list given to you today.  Labwork: Your physician recommends that you return for lab work at your convenience - FASTING.  Testing/Procedures: 1. Exercise Tolerance Test - Your physician has requested that you have an exercise tolerance test. For further information please visit [email or web address]. Please also follow instruction sheet, as given.  >> This has been ordered to be completed at [facility]  Follow-up: Dr Sallyanne Kuster recommends that you schedule a follow-up appointment in 12 months. You will receive a reminder letter in the mail two months in advance. If you don't receive a letter, please call our office to schedule the follow-up appointment.  If you need a refill on your cardiac medications before your next appointment, please call your pharmacy.

## 2017-10-26 ENCOUNTER — Encounter: Payer: Self-pay | Admitting: Cardiovascular Disease

## 2017-11-06 ENCOUNTER — Encounter (HOSPITAL_COMMUNITY): Payer: PPO

## 2017-11-07 ENCOUNTER — Ambulatory Visit (HOSPITAL_COMMUNITY)
Admission: RE | Admit: 2017-11-07 | Discharge: 2017-11-07 | Disposition: A | Discharge status: Home / Self Care | Payer: PPO | Source: Ambulatory Visit | Attending: Cardiovascular Disease | Admitting: Cardiovascular Disease

## 2017-11-07 DIAGNOSIS — I2510 Atherosclerotic heart disease of native coronary artery without angina pectoris: Secondary | ICD-10-CM | POA: Diagnosis not present

## 2017-11-07 LAB — EXERCISE TOLERANCE TEST
Estimated workload: 13.4 METS
Exercise duration (min): 11 min
Exercise duration (sec): 0 s
MPHR: 153 {beats}/min
Peak HR: 162 {beats}/min
Percent HR: 105 %
RPE: 13
Rest HR: 67 {beats}/min

## 2017-11-14 DIAGNOSIS — Z6831 Body mass index (BMI) 31.0-31.9, adult: Secondary | ICD-10-CM | POA: Diagnosis not present

## 2017-11-14 DIAGNOSIS — E6609 Other obesity due to excess calories: Secondary | ICD-10-CM | POA: Diagnosis not present

## 2017-11-14 DIAGNOSIS — L255 Unspecified contact dermatitis due to plants, except food: Secondary | ICD-10-CM | POA: Diagnosis not present

## 2017-11-14 DIAGNOSIS — Z1389 Encounter for screening for other disorder: Secondary | ICD-10-CM | POA: Diagnosis not present

## 2017-11-28 ENCOUNTER — Ambulatory Visit (INDEPENDENT_AMBULATORY_CARE_PROVIDER_SITE_OTHER): Payer: PPO | Admitting: Otolaryngology

## 2017-11-28 DIAGNOSIS — J310 Chronic rhinitis: Secondary | ICD-10-CM

## 2017-11-28 DIAGNOSIS — J343 Hypertrophy of nasal turbinates: Secondary | ICD-10-CM

## 2017-12-09 MED ORDER — LISINOPRIL 5 MG PO TABS: 5.0000 mg | ORAL_TABLET | Freq: Every day | ORAL | 3 refills | Status: DC

## 2017-12-09 MED ORDER — SIMVASTATIN 20 MG PO TABS: 20.0000 mg | ORAL_TABLET | Freq: Every day | ORAL | 3 refills | Status: DC

## 2018-01-06 DIAGNOSIS — J301 Allergic rhinitis due to pollen: Secondary | ICD-10-CM | POA: Diagnosis not present

## 2018-01-06 DIAGNOSIS — J019 Acute sinusitis, unspecified: Secondary | ICD-10-CM | POA: Diagnosis not present

## 2018-01-06 DIAGNOSIS — Z6832 Body mass index (BMI) 32.0-32.9, adult: Secondary | ICD-10-CM | POA: Diagnosis not present

## 2018-01-06 DIAGNOSIS — E6609 Other obesity due to excess calories: Secondary | ICD-10-CM | POA: Diagnosis not present

## 2018-01-09 ENCOUNTER — Ambulatory Visit (INDEPENDENT_AMBULATORY_CARE_PROVIDER_SITE_OTHER): Payer: PPO | Admitting: Otolaryngology

## 2018-01-09 DIAGNOSIS — J342 Deviated nasal septum: Secondary | ICD-10-CM | POA: Diagnosis not present

## 2018-01-09 DIAGNOSIS — J343 Hypertrophy of nasal turbinates: Secondary | ICD-10-CM | POA: Diagnosis not present

## 2018-01-09 DIAGNOSIS — J310 Chronic rhinitis: Secondary | ICD-10-CM

## 2018-02-11 DIAGNOSIS — Z0001 Encounter for general adult medical examination with abnormal findings: Secondary | ICD-10-CM | POA: Diagnosis not present

## 2018-02-11 DIAGNOSIS — Z681 Body mass index (BMI) 19 or less, adult: Secondary | ICD-10-CM | POA: Diagnosis not present

## 2018-02-11 DIAGNOSIS — Z0000 Encounter for general adult medical examination without abnormal findings: Secondary | ICD-10-CM | POA: Diagnosis not present

## 2018-02-11 DIAGNOSIS — Z1389 Encounter for screening for other disorder: Secondary | ICD-10-CM | POA: Diagnosis not present

## 2018-09-11 ENCOUNTER — Other Ambulatory Visit: Payer: Self-pay

## 2018-09-11 MED ORDER — SIMVASTATIN 20 MG PO TABS: 20.0000 mg | ORAL_TABLET | Freq: Every day | ORAL | 1 refills | Status: DC

## 2018-09-11 MED ORDER — LISINOPRIL 5 MG PO TABS: 5.0000 mg | ORAL_TABLET | Freq: Every day | ORAL | 1 refills | Status: DC

## 2018-12-22 DIAGNOSIS — Z1283 Encounter for screening for malignant neoplasm of skin: Secondary | ICD-10-CM | POA: Diagnosis not present

## 2018-12-22 DIAGNOSIS — Z85828 Personal history of other malignant neoplasm of skin: Secondary | ICD-10-CM | POA: Diagnosis not present

## 2018-12-22 DIAGNOSIS — D225 Melanocytic nevi of trunk: Secondary | ICD-10-CM | POA: Diagnosis not present

## 2018-12-22 DIAGNOSIS — L308 Other specified dermatitis: Secondary | ICD-10-CM | POA: Diagnosis not present

## 2018-12-22 DIAGNOSIS — Z08 Encounter for follow-up examination after completed treatment for malignant neoplasm: Secondary | ICD-10-CM | POA: Diagnosis not present

## 2018-12-24 ENCOUNTER — Telehealth (INDEPENDENT_AMBULATORY_CARE_PROVIDER_SITE_OTHER): Payer: PPO | Admitting: Cardiovascular Disease

## 2018-12-24 ENCOUNTER — Encounter: Payer: Self-pay | Admitting: Cardiovascular Disease

## 2018-12-24 DIAGNOSIS — I2510 Atherosclerotic heart disease of native coronary artery without angina pectoris: Secondary | ICD-10-CM

## 2018-12-24 DIAGNOSIS — E669 Obesity, unspecified: Secondary | ICD-10-CM

## 2018-12-24 DIAGNOSIS — E785 Hyperlipidemia, unspecified: Secondary | ICD-10-CM | POA: Diagnosis not present

## 2018-12-24 DIAGNOSIS — E66811 Obesity, class 1: Secondary | ICD-10-CM

## 2018-12-24 DIAGNOSIS — I10 Essential (primary) hypertension: Secondary | ICD-10-CM

## 2018-12-24 NOTE — Progress Notes (Signed)
Virtual Visit via Telephone Note   This visit type was conducted due to national recommendations for restrictions regarding the COVID-19 Pandemic (e.g. social distancing) in an effort to limit this patient's exposure and mitigate transmission in our community.  Due to his co-morbid illnesses, this patient is at least at moderate risk for complications without adequate follow up.  This format is felt to be most appropriate for this patient at this time.  The patient did not have access to video technology/had technical difficulties with video requiring transitioning to audio format only (telephone).  All issues noted in this document were discussed and addressed.  No physical exam could be performed with this format.  Please refer to the patient's chart for his  consent to telehealth for [facility].   Date:  12/24/2018   ID:  Christopher Obrien, DOB October 29, 1950, MRN [id number]  Patient Location: Home Provider Location: Office  PCP:  Redmond School, MD  Cardiologist:  Sanda Klein, MD  Electrophysiologist:  None   Evaluation Performed:  Follow-Up Visit  Chief Complaint:  CAD  History of Present Illness:    Christopher Obrien is a 68 y.o. male with obesity, hypertension and dyslipidemia (low HDL, increased LDL) and a remote history of non-STEMI with stent to the oblique marginal artery in 2005, but without subsequent coronary or vascular events.  The patient specifically denies any chest pain at rest exertion, dyspnea at rest or with exertion, orthopnea, paroxysmal nocturnal dyspnea, syncope, palpitations, focal neurological deficits, intermittent claudication, lower extremity edema, unexplained weight gain, cough, hemoptysis or wheezing.  In October 2019 on a Bruce protocol treadmill test he exercised for 11 minutes/13.4 METs without angina or ischemic ECG changes.  He has been going to the gym on a regular basis and using the weight machines really helped him shed some pounds, but during the  coronavirus pandemic restrictions he has been limited to walking and has steadily gained back weight.  The patient does not have symptoms concerning for COVID-19 infection (fever, chills, cough, or new shortness of breath).    Past Medical History:  Diagnosis Date  . CAD (coronary artery disease)   . Hyperlipidemia   . [place]STEM[place]non[place]ST elevated [place] 2005  . Systemic hypertension    Past Surgical History:  Procedure Laterality Date  . APPENDECTOMY  1995  . COLONOSCOPY N/A 01/12/2013   Procedure: COLONOSCOPY;  Surgeon: Danie Binder, MD;  [facility];  Service: Endoscopy;  Laterality: N/A;  10:30   . CORONARY ANGIOPLASTY WITH STENT PLACEMENT  07/09/2003   bare metal stent to the first oblique marginal artery  . NM MYOCAR PERF WALL MOTION  03/15/2008   normal     Current Meds  Medication Sig  . aspirin 81 MG tablet Take 81 mg by mouth at bedtime.   . Azelastine HCl 0.15 % SOLN Place 1-2 sprays into both nostrils 2 (two) times daily as needed.  Marland Kitchen lisinopril (ZESTRIL) 5 MG tablet Take 1 tablet (5 mg total) by mouth daily.  . simvastatin (ZOCOR) 20 MG tablet Take 1 tablet (20 mg total) by mouth at bedtime.  Marland Kitchen zinc gluconate 50 MG tablet Take 50 mg by mouth daily.     Allergies:   Bee venom and Penicillins   Social History   Tobacco Use  . Smoking status: Never Smoker  . Smokeless tobacco: Never Used  Substance Use Topics  . Alcohol use: No  . Drug use: No     Family Hx:  The patient's family history is not on file. He was adopted.  ROS:   Please see the history of present illness.     All other systems reviewed and are negative.   Prior CV studies:   The following studies were reviewed today: Treadmill stress test October 2019  Labs/Other Tests and Data Reviewed:    EKG:  An ECG dated 11/07/2017 was personally reviewed today and demonstrated:  Sinus rhythm, nonspecific minor intraventricular conduction delay  Recent Labs: No  results found for requested labs within last 8760 hours.   Recent Lipid Panel Lab Results  Component Value Date/Time   CHOL 106 10/24/2017 08:52 AM   TRIG 74 10/24/2017 08:52 AM   HDL 39 (L) 10/24/2017 08:52 AM   CHOLHDL 2.7 10/24/2017 08:52 AM   CHOLHDL 2.5 11/21/2015 10:45 AM   LDLCALC 52 10/24/2017 08:52 AM    Wt Readings from Last 3 Encounters:  12/24/18 226 lb (102.5 kg)  10/24/17 216 lb (98 kg)  09/03/17 222 lb (100.7 kg)     Objective:    Vital Signs:  BP (!) 141/78   Pulse 74   Ht 5\' 8"  (1.727 m)   Wt 226 lb (102.5 kg)   BMI 34.36 kg/m    VITAL SIGNS:  reviewed Unable to examine  ASSESSMENT & PLAN:    1. CAD: Asymptomatic despite fairly active lifestyle.  The focus remains on risk factor modification. 2. HLP: Time to repeat his lipid profile.  His most recent LDL was 52 and HDL 39, suspect the latter may have worsened with his weight gain. 3. HTN: Controlled.  Usually lower than recorded today. 4. Obesity: Told him not to get discouraged.  Continue walking and trying to do some exercises with resistance bands and light dumbbells.  Hopefully will shed the pounds once he is able to return to the gym.  COVID-19 Education: The signs and symptoms of COVID-19 were discussed with the patient and how to seek care for testing (follow up with PCP or arrange E-visit).  The importance of social distancing was discussed today.  Time:   Today, I have spent 16 minutes with the patient with telehealth technology discussing the above problems.     Medication Adjustments/Labs and Tests Ordered: Current medicines are reviewed at length with the patient today.  Concerns regarding medicines are outlined above.   Tests Ordered: No orders of the defined types were placed in this encounter.   Medication Changes: No orders of the defined types were placed in this encounter.   Follow Up:  In Person 12 months  Signed, Sanda Klein, MD  12/24/2018 10:02 AM    [place]

## 2018-12-24 NOTE — Patient Instructions (Signed)
Medication Instructions:  No changes *If you need a refill on your cardiac medications before your next appointment, please call your pharmacy*  Lab Work: Please have a fasting lipid completed at your PCP.  If you have labs (blood work) drawn today and your tests are completely normal, you will receive your results only by: Marland Kitchen MyChart Message (if you have MyChart) OR . A paper copy in the mail If you have any lab test that is abnormal or we need to change your treatment, we will call you to review the results.  Testing/Procedures: None ordered  Follow-Up: At [facility], you and your health needs are our priority.  As part of our continuing mission to provide you with exceptional heart care, we have created designated Provider Care Teams.  These Care Teams include your primary Cardiologist (physician) and Advanced Practice Providers (APPs -  Physician Assistants and Nurse Practitioners) who all work together to provide you with the care you need, when you need it.  Your next appointment:   12 month(s)  The format for your next appointment:   In Person  Provider:   Sanda Klein, MD

## 2019-03-04 DIAGNOSIS — E7849 Other hyperlipidemia: Secondary | ICD-10-CM | POA: Diagnosis not present

## 2019-03-04 DIAGNOSIS — Z0001 Encounter for general adult medical examination with abnormal findings: Secondary | ICD-10-CM | POA: Diagnosis not present

## 2019-03-04 DIAGNOSIS — E6609 Other obesity due to excess calories: Secondary | ICD-10-CM | POA: Diagnosis not present

## 2019-03-04 DIAGNOSIS — Z1389 Encounter for screening for other disorder: Secondary | ICD-10-CM | POA: Diagnosis not present

## 2019-03-04 DIAGNOSIS — Z6833 Body mass index (BMI) 33.0-33.9, adult: Secondary | ICD-10-CM | POA: Diagnosis not present

## 2019-03-04 DIAGNOSIS — Z0000 Encounter for general adult medical examination without abnormal findings: Secondary | ICD-10-CM | POA: Diagnosis not present

## 2019-03-18 ENCOUNTER — Other Ambulatory Visit: Payer: Self-pay

## 2019-03-18 ENCOUNTER — Ambulatory Visit: Payer: PPO | Attending: Internal Medicine

## 2019-03-18 DIAGNOSIS — Z23 Encounter for immunization: Secondary | ICD-10-CM | POA: Insufficient documentation

## 2019-03-18 NOTE — Progress Notes (Signed)
   Covid-19 Vaccination Clinic  Name:  MCHENRY LABA    MRN: [id number] DOB: [birth year]  03/18/2019  Mr. Rajaram was observed post Covid-19 immunization for 30 minutes based on pre-vaccination screening without incidence. He was provided with Vaccine Information Sheet and instruction to access the V-Safe system.   Mr. Dirickson was instructed to call 911 with any severe reactions post vaccine: Marland Kitchen Difficulty breathing  . Swelling of your face and throat  . A fast heartbeat  . A bad rash all over your body  . Dizziness and weakness    Immunizations Administered    Name Date Dose VIS Date Route   Moderna COVID-19 Vaccine 03/18/2019 10:10 AM 0.5 mL 12/30/2018 Intramuscular   Manufacturer: Moderna   Lot: [id number]   [id number]

## 2019-04-15 ENCOUNTER — Ambulatory Visit: Payer: PPO | Attending: Family

## 2019-04-15 DIAGNOSIS — Z23 Encounter for immunization: Secondary | ICD-10-CM

## 2019-04-15 NOTE — Progress Notes (Signed)
   Covid-19 Vaccination Clinic  Name:  Christopher Obrien    MRN: [id number] DOB: 1950/04/25  04/15/2019  Christopher Obrien was observed post Covid-19 immunization for 15 minutes without incident. He was provided with Vaccine Information Sheet and instruction to access the V-Safe system.   Christopher Obrien was instructed to call 911 with any severe reactions post vaccine: Marland Kitchen Difficulty breathing  . Swelling of face and throat  . A fast heartbeat  . A bad rash all over body  . Dizziness and weakness   Immunizations Administered    Name Date Dose VIS Date Route   Moderna COVID-19 Vaccine 04/15/2019 10:00 AM 0.5 mL 12/30/2018 Intramuscular   Manufacturer: Moderna   Lot: [id number]   [id number]

## 2019-04-16 DIAGNOSIS — H905 Unspecified sensorineural hearing loss: Secondary | ICD-10-CM | POA: Diagnosis not present

## 2019-05-12 DIAGNOSIS — H905 Unspecified sensorineural hearing loss: Secondary | ICD-10-CM | POA: Diagnosis not present

## 2019-09-28 DIAGNOSIS — Z08 Encounter for follow-up examination after completed treatment for malignant neoplasm: Secondary | ICD-10-CM | POA: Diagnosis not present

## 2019-09-28 DIAGNOSIS — Z85828 Personal history of other malignant neoplasm of skin: Secondary | ICD-10-CM | POA: Diagnosis not present

## 2019-09-28 DIAGNOSIS — L820 Inflamed seborrheic keratosis: Secondary | ICD-10-CM | POA: Diagnosis not present

## 2019-09-28 DIAGNOSIS — D225 Melanocytic nevi of trunk: Secondary | ICD-10-CM | POA: Diagnosis not present

## 2019-09-28 DIAGNOSIS — Z1283 Encounter for screening for malignant neoplasm of skin: Secondary | ICD-10-CM | POA: Diagnosis not present

## 2019-11-24 MED ORDER — SIMVASTATIN 20 MG PO TABS: 20.0000 mg | ORAL_TABLET | Freq: Every day | ORAL | 0 refills | Status: DC

## 2019-11-24 MED ORDER — LISINOPRIL 5 MG PO TABS: 5.0000 mg | ORAL_TABLET | Freq: Every day | ORAL | 0 refills | Status: DC

## 2019-12-29 ENCOUNTER — Encounter: Payer: Self-pay | Admitting: Cardiovascular Disease

## 2019-12-29 ENCOUNTER — Ambulatory Visit: Payer: PPO | Admitting: Cardiovascular Disease

## 2019-12-29 ENCOUNTER — Other Ambulatory Visit: Payer: Self-pay

## 2019-12-29 VITALS — BP 128/82 | HR 65 | Ht 68.0 in | Wt 226.2 lb

## 2019-12-29 DIAGNOSIS — I10 Essential (primary) hypertension: Secondary | ICD-10-CM

## 2019-12-29 DIAGNOSIS — I2510 Atherosclerotic heart disease of native coronary artery without angina pectoris: Secondary | ICD-10-CM

## 2019-12-29 DIAGNOSIS — E785 Hyperlipidemia, unspecified: Secondary | ICD-10-CM

## 2019-12-29 LAB — COMPREHENSIVE METABOLIC PANEL
ALT: 35 IU/L (ref 0–44)
AST: 30 IU/L (ref 0–40)
Albumin/Globulin Ratio: 1.8 (ref 1.2–2.2)
Albumin: 4.6 g/dL (ref 3.8–4.8)
Alkaline Phosphatase: 83 IU/L (ref 44–121)
BUN/Creatinine Ratio: 13 (ref 10–24)
BUN: 13 mg/dL (ref 8–27)
Bilirubin Total: 1.2 mg/dL (ref 0.0–1.2)
CO2: 26 mmol/L (ref 20–29)
Calcium: 10.1 mg/dL (ref 8.6–10.2)
Chloride: 100 mmol/L (ref 96–106)
Creatinine, Ser: 1.04 mg/dL (ref 0.76–1.27)
GFR calc Af Amer: 84 mL/min/{1.73_m2} (ref >59)
GFR calc non Af Amer: 73 mL/min/{1.73_m2} (ref >59)
Globulin, Total: 2.6 g/dL (ref 1.5–4.5)
Glucose: 94 mg/dL (ref 65–99)
Potassium: 4.9 mmol/L (ref 3.5–5.2)
Sodium: 139 mmol/L (ref 134–144)
Total Protein: 7.2 g/dL (ref 6.0–8.5)

## 2019-12-29 LAB — CBC
Hematocrit: 46.6 % (ref 37.5–51.0)
Hemoglobin: 16.2 g/dL (ref 13.0–17.7)
MCH: 30.3 pg (ref 26.6–33.0)
MCHC: 34.8 g/dL (ref 31.5–35.7)
MCV: 87 fL (ref 79–97)
Platelets: 290 10*3/uL (ref 150–450)
RBC: 5.34 x10E6/uL (ref 4.14–5.80)
RDW: 12.1 % (ref 11.6–15.4)
WBC: 8.3 10*3/uL (ref 3.4–10.8)

## 2019-12-29 LAB — PSA: Prostate Specific Ag, Serum: 1.3 ng/mL (ref 0.0–4.0)

## 2019-12-29 LAB — LIPID PANEL
Chol/HDL Ratio: 2.8 ratio (ref 0.0–5.0)
Cholesterol, Total: 124 mg/dL (ref 100–199)
HDL: 44 mg/dL (ref >39)
LDL Chol Calc (NIH): 63 mg/dL (ref 0–99)
Triglycerides: 90 mg/dL (ref 0–149)
VLDL Cholesterol Cal: 17 mg/dL (ref 5–40)

## 2019-12-29 MED ORDER — SIMVASTATIN 20 MG PO TABS: 20.0000 mg | ORAL_TABLET | Freq: Every day | ORAL | 3 refills | Status: DC

## 2019-12-29 MED ORDER — LISINOPRIL 5 MG PO TABS: 5.0000 mg | ORAL_TABLET | Freq: Every day | ORAL | 3 refills | Status: DC

## 2019-12-29 NOTE — Progress Notes (Signed)
Cardiology office note   Date:  12/29/2019   ID:  Christopher Obrien, DOB [birth year], MRN [id number]  PCP:  Redmond School, MD  Cardiologist:  Sanda Klein, MD  Electrophysiologist:  None   Evaluation Performed:  Follow-Up Visit  Chief Complaint:  CAD  History of Present Illness:    Christopher Obrien is a 69 y.o. male with obesity, hypertension and dyslipidemia (low HDL, increased LDL) and a remote history of non-STEMI with stent to the oblique marginal artery in 2005, but without subsequent coronary or vascular events.  He feels great.  He has returned to exercising at the [facility] 4-5 times a week.  He uses every weight machine he can get to and also walks on the treadmill at 3-4 mph.  He is never limited by shortness of breath or chest discomfort.  Over the last couple of weeks he switched from treadmill exercise to an elliptical because of left knee pain.  Despite the physical exercise, he is frustrated with his inability to lose weight.  He is seen a slight reduction in his weight over last couple of weeks after completely eliminating sweets from his diet.  In October 2019 on a Bruce protocol treadmill test he exercised for 11 minutes/13.4 METs without angina or ischemic ECG changes.   Past Medical History:  Diagnosis Date  . CAD (coronary artery disease)   . Hyperlipidemia   . NSTEMI (non-ST elevated myocardial infarction) ([place]) 2005  . Systemic hypertension    Past Surgical History:  Procedure Laterality Date  . APPENDECTOMY  1995  . COLONOSCOPY N/A 01/12/2013   Procedure: COLONOSCOPY;  Surgeon: Danie Binder, MD;  Location[facility];  Service: Endoscopy;  Laterality: N/A;  10:30   . CORONARY ANGIOPLASTY WITH STENT PLACEMENT  07/09/2003   bare metal stent to the first oblique marginal artery  . NM MYOCAR PERF WALL MOTION  03/15/2008   normal     Current Meds  Medication Sig  . aspirin 81 MG tablet Take 81 mg by mouth at bedtime.   Marland Kitchen lisinopril (ZESTRIL) 5 MG tablet Take  1 tablet (5 mg total) by mouth daily.  . simvastatin (ZOCOR) 20 MG tablet Take 1 tablet (20 mg total) by mouth at bedtime.     Allergies:   Bee venom and Penicillins   Social History   Tobacco Use  . Smoking status: Never Smoker  . Smokeless tobacco: Never Used  Substance Use Topics  . Alcohol use: No  . Drug use: No     Family Hx: The patient's family history is not on file. He was adopted.  ROS:   Please see the history of present illness.     All other systems reviewed and are negative.   Prior CV studies:   The following studies were reviewed today: Treadmill stress test October 2019  Labs/Other Tests and Data Reviewed:    EKG: Ordered today shows normal sinus rhythm, borderline interventricular conduction delay (QRS 100 ms), inferior Q waves (old), QTC 400 ms Recent Labs: No results found for requested labs within last 8760 hours.   Recent Lipid Panel Lab Results  Component Value Date/Time   CHOL 106 10/24/2017 08:52 AM   TRIG 74 10/24/2017 08:52 AM   HDL 39 (L) 10/24/2017 08:52 AM   CHOLHDL 2.7 10/24/2017 08:52 AM   CHOLHDL 2.5 11/21/2015 10:45 AM   LDLCALC 52 10/24/2017 08:52 AM   03/04/2019 Total cholesterol 113, HDL 45, LDL 51, triglycerides 85 Hemoglobin 15.8, creatinine 1.14,  potassium 5.6, liver function tests Wt Readings from Last 3 Encounters:  12/29/19 226 lb 3.2 oz (102.6 kg)  12/24/18 226 lb (102.5 kg)  10/24/17 216 lb (98 kg)     Objective:    Vital Signs:  BP 128/82   Pulse 65   Ht 5\' 8"  (1.727 m)   Wt 226 lb 3.2 oz (102.6 kg)   SpO2 97%   BMI 34.39 kg/m     General: Alert, oriented x3, no distress, mildly obese Head: no evidence of trauma, PERRL, EOMI, no exophtalmos or lid lag, no myxedema, no xanthelasma; normal ears, nose and oropharynx Neck: normal jugular venous pulsations and no hepatojugular reflux; brisk carotid pulses without delay and no carotid bruits Chest: clear to auscultation, no signs of consolidation by percussion  or palpation, normal fremitus, symmetrical and full respiratory excursions Cardiovascular: normal position and quality of the apical impulse, regular rhythm, normal first and second heart sounds, no murmurs, rubs or gallops Abdomen: no tenderness or distention, no masses by palpation, no abnormal pulsatility or arterial bruits, normal bowel sounds, no hepatosplenomegaly Extremities: no clubbing, cyanosis or edema; 2+ radial, ulnar and brachial pulses bilaterally; 2+ right femoral, posterior tibial and dorsalis pedis pulses; 2+ left femoral, posterior tibial and dorsalis pedis pulses; no subclavian or femoral bruits Neurological: grossly nonfocal Psych: Normal mood and affect   ASSESSMENT & PLAN:    1. CAD: He is very active and despite this does not have angina, dyspnea or other cardiovascular complaints. 2. HLP: LDL cholesterol is well within target range and his HDL cholesterol has improved this year, likely due to more physical activity.  He appears to be metabolically healthy despite his increased weight.  I think he should be congratulated for his commitment to healthy lifestyle and should not not be frustrated by his inability to actually lose weight.  Nevertheless, advised that he should also cut back on starchy foods with high glycemic index such as potatoes, bread, pasta, rice, etc. 3. HTN: Very well controlled. 4. Obesity: Told him not to get discouraged.  Continue combination of aerobic exercise and weights to increase calories burden. Patient Instructions  Medication Instructions:  No changes *If you need a refill on your cardiac medications before your next appointment, please call your pharmacy*   Lab Work: Your provider would like for you to have the following labs today: lipid, cbc, cmet and PSA  If you have labs (blood work) drawn today and your tests are completely normal, you will receive your results only by: Marland Kitchen MyChart Message (if you have MyChart) OR . A paper copy in  the mail If you have any lab test that is abnormal or we need to change your treatment, we will call you to review the results.   Testing/Procedures: None ordered   Follow-Up: At [facility], you and your health needs are our priority.  As part of our continuing mission to provide you with exceptional heart care, we have created designated Provider Care Teams.  These Care Teams include your primary Cardiologist (physician) and Advanced Practice Providers (APPs -  Physician Assistants and Nurse Practitioners) who all work together to provide you with the care you need, when you need it.  We recommend signing up for the patient portal called "MyChart".  Sign up information is provided on this After Visit Summary.  MyChart is used to connect with patients for Virtual Visits (Telemedicine).  Patients are able to view lab/test results, encounter notes, upcoming appointments, etc.  Non-urgent messages can  be sent to your provider as well.   To learn more about what you can do with MyChart, go to [email or web address].    Your next appointment:   12 month(s)  The format for your next appointment:   In Person  Provider:   You may see Sanda Klein, MD or one of the following Advanced Practice Providers on your designated Care Team:    Almyra Deforest, PA-C  Fabian Sharp, [place] or   Roby Lofts, PA-C       Signed, Sanda Klein, MD  12/29/2019 9:13 AM    [place]

## 2019-12-29 NOTE — Patient Instructions (Signed)
Medication Instructions:  No changes *If you need a refill on your cardiac medications before your next appointment, please call your pharmacy*   Lab Work: Your provider would like for you to have the following labs today: lipid, cbc, cmet and PSA  If you have labs (blood work) drawn today and your tests are completely normal, you will receive your results only by:  [place] (if you have MyChart) OR  A paper copy in the mail If you have any lab test that is abnormal or we need to change your treatment, we will call you to review the results.   Testing/Procedures: None ordered   Follow-Up: At [facility], you and your health needs are our priority.  As part of our continuing mission to provide you with exceptional heart care, we have created designated Provider Care Teams.  These Care Teams include your primary Cardiologist (physician) and Advanced Practice Providers (APPs -  Physician Assistants and Nurse Practitioners) who all work together to provide you with the care you need, when you need it.  We recommend signing up for the patient portal called "MyChart".  Sign up information is provided on this After Visit Summary.  MyChart is used to connect with patients for Virtual Visits (Telemedicine).  Patients are able to view lab/test results, encounter notes, upcoming appointments, etc.  Non-urgent messages can be sent to your provider as well.   To learn more about what you can do with MyChart, go to [email or web address].    Your next appointment:   12 month(s)  The format for your next appointment:   In Person  Provider:   You may see Sanda Klein, MD or one of the following Advanced Practice Providers on your designated Care Team:    Almyra Deforest, PA-C  Fabian Sharp, PA-C or   Roby Lofts, [place]

## 2020-03-08 DIAGNOSIS — Z6833 Body mass index (BMI) 33.0-33.9, adult: Secondary | ICD-10-CM | POA: Diagnosis not present

## 2020-03-08 DIAGNOSIS — Z1331 Encounter for screening for depression: Secondary | ICD-10-CM | POA: Diagnosis not present

## 2020-03-08 DIAGNOSIS — Z0000 Encounter for general adult medical examination without abnormal findings: Secondary | ICD-10-CM | POA: Diagnosis not present

## 2020-03-08 DIAGNOSIS — E559 Vitamin D deficiency, unspecified: Secondary | ICD-10-CM | POA: Diagnosis not present

## 2020-03-08 DIAGNOSIS — E6609 Other obesity due to excess calories: Secondary | ICD-10-CM | POA: Diagnosis not present

## 2020-06-23 ENCOUNTER — Encounter (HOSPITAL_COMMUNITY): Payer: Self-pay | Admitting: *Deleted

## 2020-06-23 ENCOUNTER — Emergency Department (HOSPITAL_COMMUNITY)
Admission: EM | Admit: 2020-06-23 | Discharge: 2020-06-23 | Disposition: A | Discharge status: Home / Self Care | Payer: PPO | Attending: Emergency Medicine | Admitting: Emergency Medicine

## 2020-06-23 ENCOUNTER — Other Ambulatory Visit: Payer: Self-pay

## 2020-06-23 ENCOUNTER — Emergency Department (HOSPITAL_COMMUNITY): Payer: PPO

## 2020-06-23 DIAGNOSIS — I517 Cardiomegaly: Secondary | ICD-10-CM | POA: Diagnosis not present

## 2020-06-23 DIAGNOSIS — I2510 Atherosclerotic heart disease of native coronary artery without angina pectoris: Secondary | ICD-10-CM | POA: Diagnosis not present

## 2020-06-23 DIAGNOSIS — W57XXXA Bitten or stung by nonvenomous insect and other nonvenomous arthropods, initial encounter: Secondary | ICD-10-CM | POA: Insufficient documentation

## 2020-06-23 DIAGNOSIS — Z7982 Long term (current) use of aspirin: Secondary | ICD-10-CM | POA: Insufficient documentation

## 2020-06-23 DIAGNOSIS — Z79899 Other long term (current) drug therapy: Secondary | ICD-10-CM | POA: Diagnosis not present

## 2020-06-23 DIAGNOSIS — Z955 Presence of coronary angioplasty implant and graft: Secondary | ICD-10-CM | POA: Insufficient documentation

## 2020-06-23 DIAGNOSIS — R0789 Other chest pain: Secondary | ICD-10-CM

## 2020-06-23 DIAGNOSIS — S30860A Insect bite (nonvenomous) of lower back and pelvis, initial encounter: Secondary | ICD-10-CM | POA: Diagnosis not present

## 2020-06-23 DIAGNOSIS — I10 Essential (primary) hypertension: Secondary | ICD-10-CM | POA: Insufficient documentation

## 2020-06-23 DIAGNOSIS — R079 Chest pain, unspecified: Secondary | ICD-10-CM | POA: Diagnosis not present

## 2020-06-23 DIAGNOSIS — R072 Precordial pain: Secondary | ICD-10-CM

## 2020-06-23 DIAGNOSIS — S20461A Insect bite (nonvenomous) of right back wall of thorax, initial encounter: Secondary | ICD-10-CM | POA: Insufficient documentation

## 2020-06-23 LAB — COMPREHENSIVE METABOLIC PANEL
ALT: 39 U/L (ref 0–44)
AST: 27 U/L (ref 15–41)
Albumin: 4.4 g/dL (ref 3.5–5.0)
Alkaline Phosphatase: 72 U/L (ref 38–126)
Anion gap: 6 (ref 5–15)
BUN: 20 mg/dL (ref 8–23)
CO2: 28 mmol/L (ref 22–32)
Calcium: 9.3 mg/dL (ref 8.9–10.3)
Chloride: 103 mmol/L (ref 98–111)
Creatinine, Ser: 0.92 mg/dL (ref 0.61–1.24)
GFR, Estimated: >60 mL/min (ref >60)
Glucose, Bld: 112 mg/dL — ABNORMAL HIGH (ref 70–99)
Potassium: 4.3 mmol/L (ref 3.5–5.1)
Sodium: 137 mmol/L (ref 135–145)
Total Bilirubin: 0.9 mg/dL (ref 0.3–1.2)
Total Protein: 7.4 g/dL (ref 6.5–8.1)

## 2020-06-23 LAB — CBC
HCT: 44.6 % (ref 39.0–52.0)
Hemoglobin: 15.2 g/dL (ref 13.0–17.0)
MCH: 30.5 pg (ref 26.0–34.0)
MCHC: 34.1 g/dL (ref 30.0–36.0)
MCV: 89.4 fL (ref 80.0–100.0)
Platelets: 263 10*3/uL (ref 150–400)
RBC: 4.99 MIL/uL (ref 4.22–5.81)
RDW: 11.9 % (ref 11.5–15.5)
WBC: 8.3 10*3/uL (ref 4.0–10.5)
nRBC: 0 % (ref 0.0–0.2)

## 2020-06-23 LAB — TROPONIN I (HIGH SENSITIVITY)
Troponin I (High Sensitivity): 5 ng/L (ref <18)
Troponin I (High Sensitivity): 5 ng/L (ref <18)

## 2020-06-23 MED ORDER — DOXYCYCLINE HYCLATE 100 MG PO CAPS: 100.0000 mg | ORAL_CAPSULE | Freq: Two times a day (BID) | ORAL | 0 refills | Status: DC

## 2020-06-23 MED ORDER — DOXYCYCLINE HYCLATE 100 MG PO TABS
200.0000 mg | ORAL_TABLET | Freq: Once | ORAL | Status: AC
  Administered 2020-06-23: 200 mg via ORAL
  Filled 2020-06-23: qty 2

## 2020-06-23 NOTE — ED Notes (Signed)
Pt stated he got a tick bite in last few weeks. Site assessed. NO itching

## 2020-06-23 NOTE — ED Triage Notes (Signed)
Pt c/o left sided chest pressure and left lower jaw pain that started about 2 hours ago. Pt reports the jaw pain only lasted a short period of time and his chest pressure is much less now, but still present. Denies SOB and nausea. Reports some dizziness momentarily. HX MI in 2005 with stent placement.

## 2020-06-23 NOTE — Discharge Instructions (Addendum)
It was our pleasure to provide your ER care today - we hope that you feel better.  Given recent tick bite and skin lesion, take antibiotic as prescribed.   For chest discomfort, follow up with cardiologist in the coming week - call office to arrange appointment.   Return to ER if worse, new symptoms, high fevers, recurrent or persistent chest pain, increased trouble breathing, or other concern.

## 2020-06-23 NOTE — ED Provider Notes (Addendum)
[facility] EMERGENCY DEPARTMENT Provider Note   CSN: [id number] Arrival date & time: 06/23/20  6063     History Chief Complaint  Patient presents with  . Chest Pain    Christopher Obrien is a 70 y.o. male.  Patient with hx cad presents c/o dull left chest pain at rest this AM. Occurred while resting, dull, mild, ?left face/jaw also felt odd/funny at time, lasted ~ a few minutes, now resolved. No constant and/or pleuritic chest pain. No associated nv, diaphoresis or sob. Did not feel same as prior cardiac symptoms. Remote hx stent. Denies any other recent chest pain or discomfort. States exercises 3-4 x per week and no recent chest discomfort with exercise/exertion, no unusual doe or fatigue. Denies cough or uri symptoms. No heartburn. No chest wall injury or strain. No leg pain or swelling. No hx dvt or pe. States compliant w normal meds.   The history is provided by the patient.  Chest Pain Associated symptoms: no abdominal pain, no back pain, no fever, no headache, no nausea, no shortness of breath and no vomiting        Past Medical History:  Diagnosis Date  . CAD (coronary artery disease)   . Hyperlipidemia   . NSTEMI (non-ST elevated myocardial infarction) ([place]) 2005  . Systemic hypertension     Patient Active Problem List   Diagnosis Date Noted  . 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 Perennial and seasonal allergic rhinitis 09/03/2017  . CAD - NSTEMI 2005, bare metal stent OM1 11/05/2012  . Hyperlipidemia 11/05/2012  . HTN (hypertension) 11/05/2012  . Obesity (BMI 30.0-34.9) 11/05/2012    Past Surgical History:  Procedure Laterality Date  . APPENDECTOMY  1995  . CHOLECYSTECTOMY    . COLONOSCOPY N/A 01/12/2013   Procedure: COLONOSCOPY;  Surgeon: Danie Binder, MD;  Location[facility];  Service: Endoscopy;  Laterality: N/A;  10:30   . CORONARY ANGIOPLASTY WITH STENT PLACEMENT  07/09/2003   bare metal stent to the first oblique marginal artery   . NM MYOCAR PERF WALL MOTION  03/15/2008   normal       Family History  Adopted: Yes    Social History   Tobacco Use  . Smoking status: Never Smoker  . Smokeless tobacco: Never Used  Vaping Use  . Vaping Use: Never used  Substance Use Topics  . Alcohol use: No  . Drug use: No    Home Medications Prior to Admission medications   Medication Sig Start Date End Date Taking? Authorizing Provider  aspirin 81 MG tablet Take 81 mg by mouth at bedtime.     [provider]  lisinopril (ZESTRIL) 5 MG tablet Take 1 tablet (5 mg total) by mouth daily. 12/29/19   Croitoru, Mihai, MD  simvastatin (ZOCOR) 20 MG tablet Take 1 tablet (20 mg total) by mouth at bedtime. 12/29/19   Croitoru, Mihai, MD    Allergies    Bee venom and Penicillins  Review of Systems   Review of Systems  Constitutional: Negative for fever.  HENT: Negative for sore throat.   Eyes: Negative for redness.  Respiratory: Negative for shortness of breath.   Cardiovascular: Positive for chest pain. Negative for leg swelling.  Gastrointestinal: Negative for abdominal pain, nausea and vomiting.  Genitourinary: Negative for flank pain.  Musculoskeletal: Negative for back pain and neck pain.  Skin: Negative for rash.  Neurological: Negative for headaches.  Hematological: Does not bruise/bleed easily.  Psychiatric/Behavioral: Negative for confusion.  Physical Exam Updated Vital Signs BP (!) 148/82 (BP Location: Right Arm)   Pulse 75   Temp 98.5 F (36.9 C) (Oral)   Resp 17   Ht 1.727 m (5\' 8" )   Wt 102.1 kg   SpO2 97%   BMI 34.21 kg/m   Physical Exam Vitals and nursing note reviewed.  Constitutional:      Appearance: Normal appearance. He is well-developed.  HENT:     Head: Atraumatic.     Nose: Nose normal.     Mouth/Throat:     Mouth: Mucous membranes are moist.     Pharynx: Oropharynx is clear.  Eyes:     General: No scleral icterus.    Conjunctiva/sclera: Conjunctivae normal.  Neck:      Trachea: No tracheal deviation.  Cardiovascular:     Rate and Rhythm: Normal rate and regular rhythm.     Pulses: Normal pulses.     Heart sounds: Normal heart sounds. No murmur heard. No friction rub. No gallop.   Pulmonary:     Effort: Pulmonary effort is normal. No accessory muscle usage or respiratory distress.     Breath sounds: Normal breath sounds.  Chest:     Chest wall: No tenderness.  Abdominal:     General: Bowel sounds are normal. There is no distension.     Palpations: Abdomen is soft.     Tenderness: There is no abdominal tenderness. There is no guarding.  Genitourinary:    Comments: No cva tenderness. Musculoskeletal:        General: No swelling or tenderness.     Cervical back: Normal range of motion and neck supple. No rigidity.     Right lower leg: No edema.     Left lower leg: No edema.  Skin:    General: Skin is warm and dry.     Findings: No rash.  Neurological:     Mental Status: He is alert.     Comments: Alert, speech clear.   Psychiatric:        Mood and Affect: Mood normal.     ED Results / Procedures / Treatments   Labs (all labs ordered are listed, but only abnormal results are displayed) Results for orders placed or performed during the hospital encounter of 06/23/20  Comprehensive metabolic panel  Result Value Ref Range   Sodium 137 135 - 145 mmol/L   Potassium 4.3 3.5 - 5.1 mmol/L   Chloride 103 98 - 111 mmol/L   CO2 28 22 - 32 mmol/L   Glucose, Bld 112 (H) 70 - 99 mg/dL   BUN 20 8 - 23 mg/dL   Creatinine, Ser 0.92 0.61 - 1.24 mg/dL   Calcium 9.3 8.9 - 10.3 mg/dL   Total Protein 7.4 6.5 - 8.1 g/dL   Albumin 4.4 3.5 - 5.0 g/dL   AST 27 15 - 41 U/L   ALT 39 0 - 44 U/L   Alkaline Phosphatase 72 38 - 126 U/L   Total Bilirubin 0.9 0.3 - 1.2 mg/dL   GFR, Estimated >60 >60 mL/min   Anion gap 6 5 - 15  CBC  Result Value Ref Range   WBC 8.3 4.0 - 10.5 K/uL   RBC 4.99 4.22 - 5.81 MIL/uL   Hemoglobin 15.2 13.0 - 17.0 g/dL   HCT 44.6  39.0 - 52.0 %   MCV 89.4 80.0 - 100.0 fL   MCH 30.5 26.0 - 34.0 pg   MCHC 34.1 30.0 - 36.0 g/dL   RDW  11.9 11.5 - 15.5 %   Platelets 263 150 - 400 K/uL   nRBC 0.0 0.0 - 0.2 %  Troponin I (High Sensitivity)  Result Value Ref Range   Troponin I (High Sensitivity) 5 <18 ng/L  Troponin I (High Sensitivity)  Result Value Ref Range   Troponin I (High Sensitivity) 5 <18 ng/L   DG Chest Port 1 View  Result Date: 06/23/2020 CLINICAL DATA:  Left chest and jaw pain EXAM: PORTABLE CHEST 1 VIEW COMPARISON:  07/23/2003 FINDINGS: Similar mild cardiomegaly and basilar atelectasis versus scarring. No focal pneumonia, edema or CHF. Negative for effusion or pneumothorax. Trachea midline. Degenerative changes of the spine. IMPRESSION: Mild cardiomegaly and basilar atelectasis versus scarring. No superimposed acute process. Electronically Signed   By: Jerilynn Mages.  Shick M.D.   On: 06/23/2020 08:05    EKG EKG Interpretation  Date/Time:  Thursday Jun 23 2020 07:18:33 EDT Ventricular Rate:  72 PR Interval:  137 QRS Duration: 105 QT Interval:  389 QTC Calculation: 426 R Axis:   -35 Text Interpretation: Sinus rhythm Left axis deviation Low voltage, precordial leads No significant change since last tracing Confirmed by Lajean Saver [phone]) on 06/23/2020 7:23:02 AM   Radiology DG Chest Port 1 View  Result Date: 06/23/2020 CLINICAL DATA:  Left chest and jaw pain EXAM: PORTABLE CHEST 1 VIEW COMPARISON:  07/23/2003 FINDINGS: Similar mild cardiomegaly and basilar atelectasis versus scarring. No focal pneumonia, edema or CHF. Negative for effusion or pneumothorax. Trachea midline. Degenerative changes of the spine. IMPRESSION: Mild cardiomegaly and basilar atelectasis versus scarring. No superimposed acute process. Electronically Signed   By: Jerilynn Mages.  Shick M.D.   On: 06/23/2020 08:05    Procedures Procedures   Medications Ordered in ED Medications - No data to display  ED Course  I have reviewed the triage vital  signs and the nursing notes.  Pertinent labs & imaging results that were available during my care of the patient were reviewed by me and considered in my medical decision making (see chart for details).    MDM Rules/Calculators/A&P                          Iv ns. Continuous pulse ox and cardiac monitoring. Stat labs and imaging. Ecg.   Reviewed nursing notes and prior charts for additional history.   Labs reviewed/interpreted by me - trop normal.  CXR reviewed/interpreted by me - no pna.   Recheck pt - no chest pain or discomfort. No sob. Await delta trop.  Additional labs reviewed/interpreted by me - delta trop normal and not increasing.  Recheck pt, pt asymptomatic. Pt currently appears stable for d/c.   Pt is noted to have recent tick vite to right back. No remaining tick or fb/parts. Mild surrounding erythema ~ 10-11 cm diameter, no target area or clearing area noted, more confluent.  No fluctuance/abscess. No pain or itching to site. Denies fever or chills. No general rash or other skin lesions. Does not feel sick or ill. No malaise or myalgias. Will give doxy for possible/suspected tick bite w erythema migrans.   For earlier atypical cp, rec close outpt cardiology f/u.  Return precautions provdied.       Final Clinical Impression(s) / ED Diagnoses Final diagnoses:  None    Rx / DC Orders ED Discharge Orders    None           Lajean Saver, MD 06/23/20 1047

## 2020-10-05 ENCOUNTER — Other Ambulatory Visit: Payer: Self-pay

## 2020-10-05 ENCOUNTER — Ambulatory Visit: Payer: PPO | Admitting: Cardiovascular Disease

## 2020-10-05 ENCOUNTER — Encounter: Payer: Self-pay | Admitting: Cardiovascular Disease

## 2020-10-05 VITALS — BP 130/74 | HR 76 | Ht 69.0 in | Wt 221.0 lb

## 2020-10-05 DIAGNOSIS — I2510 Atherosclerotic heart disease of native coronary artery without angina pectoris: Secondary | ICD-10-CM

## 2020-10-05 DIAGNOSIS — E785 Hyperlipidemia, unspecified: Secondary | ICD-10-CM

## 2020-10-05 DIAGNOSIS — I10 Essential (primary) hypertension: Secondary | ICD-10-CM | POA: Diagnosis not present

## 2020-10-05 LAB — COMPREHENSIVE METABOLIC PANEL
ALT: 26 IU/L (ref 0–44)
AST: 27 IU/L (ref 0–40)
Albumin/Globulin Ratio: 2.3 — ABNORMAL HIGH (ref 1.2–2.2)
Albumin: 4.9 g/dL — ABNORMAL HIGH (ref 3.8–4.8)
Alkaline Phosphatase: 93 IU/L (ref 44–121)
BUN/Creatinine Ratio: 13 (ref 10–24)
BUN: 14 mg/dL (ref 8–27)
Bilirubin Total: 0.7 mg/dL (ref 0.0–1.2)
CO2: 24 mmol/L (ref 20–29)
Calcium: 10.6 mg/dL — ABNORMAL HIGH (ref 8.6–10.2)
Chloride: 99 mmol/L (ref 96–106)
Creatinine, Ser: 1.07 mg/dL (ref 0.76–1.27)
Globulin, Total: 2.1 g/dL (ref 1.5–4.5)
Glucose: 102 mg/dL — ABNORMAL HIGH (ref 65–99)
Potassium: 5.4 mmol/L — ABNORMAL HIGH (ref 3.5–5.2)
Sodium: 139 mmol/L (ref 134–144)
Total Protein: 7 g/dL (ref 6.0–8.5)
eGFR: 75 mL/min/{1.73_m2} (ref >59)

## 2020-10-05 LAB — LIPID PANEL
Chol/HDL Ratio: 3.2 ratio (ref 0.0–5.0)
Cholesterol, Total: 130 mg/dL (ref 100–199)
HDL: 41 mg/dL (ref >39)
LDL Chol Calc (NIH): 70 mg/dL (ref 0–99)
Triglycerides: 100 mg/dL (ref 0–149)
VLDL Cholesterol Cal: 19 mg/dL (ref 5–40)

## 2020-10-05 LAB — CBC
Hematocrit: 46.9 % (ref 37.5–51.0)
Hemoglobin: 16.4 g/dL (ref 13.0–17.7)
MCH: 30.1 pg (ref 26.6–33.0)
MCHC: 35 g/dL (ref 31.5–35.7)
MCV: 86 fL (ref 79–97)
Platelets: 279 10*3/uL (ref 150–450)
RBC: 5.44 x10E6/uL (ref 4.14–5.80)
RDW: 11.9 % (ref 11.6–15.4)
WBC: 8.4 10*3/uL (ref 3.4–10.8)

## 2020-10-05 LAB — PSA: Prostate Specific Ag, Serum: 1.9 ng/mL (ref 0.0–4.0)

## 2020-10-05 MED ORDER — LISINOPRIL 5 MG PO TABS: 5.0000 mg | ORAL_TABLET | Freq: Every day | ORAL | 3 refills | Status: DC

## 2020-10-05 MED ORDER — SIMVASTATIN 20 MG PO TABS: 20.0000 mg | ORAL_TABLET | Freq: Every day | ORAL | 3 refills | Status: DC

## 2020-10-05 MED ORDER — SIMVASTATIN 20 MG PO TABS
20.0000 mg | ORAL_TABLET | Freq: Every day | ORAL | 3 refills | Status: DC
Start: 2020-10-05 — End: 2020-10-05

## 2020-10-05 NOTE — Patient Instructions (Signed)
Medication Instructions:  No changes *If you need a refill on your cardiac medications before your next appointment, please call your pharmacy*   Lab Work: Your provider would like for you to have the following labs today: CBC, CMET, PSA and Lipid  If you have labs (blood work) drawn today and your tests are completely normal, you will receive your results only by: [place] (if you have MyChart) OR A paper copy in the mail If you have any lab test that is abnormal or we need to change your treatment, we will call you to review the results.   Testing/Procedures: None ordered   Follow-Up: At [facility], you and your health needs are our priority.  As part of our continuing mission to provide you with exceptional heart care, we have created designated Provider Care Teams.  These Care Teams include your primary Cardiologist (physician) and Advanced Practice Providers (APPs -  Physician Assistants and Nurse Practitioners) who all work together to provide you with the care you need, when you need it.  We recommend signing up for the patient portal called "MyChart".  Sign up information is provided on this After Visit Summary.  MyChart is used to connect with patients for Virtual Visits (Telemedicine).  Patients are able to view lab/test results, encounter notes, upcoming appointments, etc.  Non-urgent messages can be sent to your provider as well.   To learn more about what you can do with MyChart, go to [email or web address].    Your next appointment:   12 month(s)  The format for your next appointment:   In Person  Provider:   You may see Sanda Klein, MD or one of the following Advanced Practice Providers on your designated Care Team:   Almyra Deforest, PA-C Fabian Sharp, PA-C or  Roby Lofts, [place]

## 2020-10-06 NOTE — Progress Notes (Signed)
Cardiology office note   Date:  10/06/2020   ID:  Christopher Obrien, DOB 02-07-1950, MRN [id number]  PCP:  Redmond School, MD  Cardiologist:  Sanda Klein, MD  Electrophysiologist:  None   Evaluation Performed:  Follow-Up Visit  Chief Complaint:  CAD  History of Present Illness:    Christopher Obrien is a 70 y.o. male with obesity, hypertension and dyslipidemia (low HDL, increased LDL) and a remote history of non-STEMI with stent to the oblique marginal artery in 2005, but without subsequent coronary or vascular events.  He had a tick bite in late May and then in June while working outside felt very ill with generalized muscle aches.  He was treated with doxycycline with rapid improvement.  Towards the end of June he had COVID-19 infection, that resolved spontaneously without serious symptoms.  He feels great.  He continues to exercise at the [facility] 3-4 times a week, mostly walking on the treadmill at 3.5 mph.  He has noticed that his heart rate never seems to be over 107-110 bpm.  The patient specifically denies any chest pain at rest exertion, dyspnea at rest or with exertion, orthopnea, paroxysmal nocturnal dyspnea, syncope, palpitations, focal neurological deficits, intermittent claudication, lower extremity edema, unexplained weight gain, cough, hemoptysis or wheezing.  In October 2019 on a Bruce protocol treadmill test he exercised for 11 minutes/13.4 METs without angina or ischemic ECG changes.   Past Medical History:  Diagnosis Date   CAD (coronary artery disease)    Hyperlipidemia    NSTEMI (non-ST elevated myocardial infarction) ([place]) 2005   Systemic hypertension    Past Surgical History:  Procedure Laterality Date   APPENDECTOMY  1995   CHOLECYSTECTOMY     COLONOSCOPY N/A 01/12/2013   Procedure: COLONOSCOPY;  Surgeon: Danie Binder, MD;  Location: [facility];  Service: Endoscopy;  Laterality: N/A;  10:30    CORONARY ANGIOPLASTY WITH STENT PLACEMENT  07/09/2003   bare  metal stent to the first oblique marginal artery   NM MYOCAR PERF WALL MOTION  03/15/2008   normal     Current Meds  Medication Sig   aspirin 81 MG tablet Take 81 mg by mouth at bedtime.    Cholecalciferol (VITAMIN D3) 125 MCG (5000 UT) CAPS Take 5,000 mg by mouth daily.   Menaquinone-7 (VITAMIN K2 PO) Take 1 capsule by mouth daily.   Zinc 50 MG CAPS Take 50 mg by mouth daily.   [DISCONTINUED] lisinopril (ZESTRIL) 5 MG tablet Take 1 tablet (5 mg total) by mouth daily.   [DISCONTINUED] simvastatin (ZOCOR) 20 MG tablet Take 1 tablet (20 mg total) by mouth at bedtime.     Allergies:   Bee venom and Penicillins   Social History   Tobacco Use   Smoking status: Never   Smokeless tobacco: Never  Vaping Use   Vaping Use: Never used  Substance Use Topics   Alcohol use: No   Drug use: No     Family Hx: The patient's family history is not on file. He was adopted.  ROS:   Please see the history of present illness.     All other systems reviewed and are negative.   Prior CV studies:   The following studies were reviewed today: Treadmill stress test October 2019  Labs/Other Tests and Data Reviewed:    EKG: Ordered today shows normal sinus rhythm, borderline interventricular conduction delay (QRS 100 ms), inferior Q waves (old), QTC 400 ms Recent Labs: 10/05/2020: ALT 26; BUN 14;  Creatinine, Ser 1.07; Hemoglobin 16.4; Platelets 279; Potassium 5.4; Sodium 139   Recent Lipid Panel Lab Results  Component Value Date/Time   CHOL 130 10/05/2020 09:15 AM   TRIG 100 10/05/2020 09:15 AM   HDL 41 10/05/2020 09:15 AM   CHOLHDL 3.2 10/05/2020 09:15 AM   CHOLHDL 2.5 11/21/2015 10:45 AM   LDLCALC 70 10/05/2020 09:15 AM   03/04/2019 Total cholesterol 113, HDL 45, LDL 51, triglycerides 85 Hemoglobin 15.8, creatinine 1.14, potassium 5.6, liver function tests Wt Readings from Last 3 Encounters:  10/05/20 221 lb (100.2 kg)  06/23/20 225 lb (102.1 kg)  12/29/19 226 lb 3.2 oz (102.6 kg)      Objective:    Vital Signs:  BP 130/74 (BP Location: Left Arm, Patient Position: Sitting, Cuff Size: Normal)   Pulse 76   Ht '5\' 9"'$  (1.753 m)   Wt 221 lb (100.2 kg)   BMI 32.64 kg/m     General: Alert, oriented x3, no distress, mildly obese Head: no evidence of trauma, PERRL, EOMI, no exophtalmos or lid lag, no myxedema, no xanthelasma; normal ears, nose and oropharynx Neck: normal jugular venous pulsations and no hepatojugular reflux; brisk carotid pulses without delay and no carotid bruits Chest: clear to auscultation, no signs of consolidation by percussion or palpation, normal fremitus, symmetrical and full respiratory excursions Cardiovascular: normal position and quality of the apical impulse, regular rhythm, normal first and second heart sounds, 2/6 systolic ejection murmur, early peaking, no diastolic murmurs, rubs or gallops Abdomen: no tenderness or distention, no masses by palpation, no abnormal pulsatility or arterial bruits, normal bowel sounds, no hepatosplenomegaly Extremities: no clubbing, cyanosis or edema; 2+ radial, ulnar and brachial pulses bilaterally; 2+ right femoral, posterior tibial and dorsalis pedis pulses; 2+ left femoral, posterior tibial and dorsalis pedis pulses; no subclavian or femoral bruits Neurological: grossly nonfocal Psych: Normal mood and affect    ASSESSMENT & PLAN:    CAD: Asymptomatic despite very active lifestyle. HLP: Labs checked today all lipid parameters are in target range, although he continues have a low normal HDL level. HTN: Good control on lisinopril monotherapy.  Note borderline high potassium on labs today, not sure if this is real or artifact.  We will ask him to recheck a metabolic panel with his primary care provider in the near future. Obesity: Congratulated him on his commitment to a healthy diet and regular exercise. Patient Instructions  Medication Instructions:  No changes *If you need a refill on your cardiac  medications before your next appointment, please call your pharmacy*   Lab Work: Your provider would like for you to have the following labs today: CBC, CMET, PSA and Lipid  If you have labs (blood work) drawn today and your tests are completely normal, you will receive your results only by: [place] (if you have MyChart) OR A paper copy in the mail If you have any lab test that is abnormal or we need to change your treatment, we will call you to review the results.   Testing/Procedures: None ordered   Follow-Up: At [facility], you and your health needs are our priority.  As part of our continuing mission to provide you with exceptional heart care, we have created designated Provider Care Teams.  These Care Teams include your primary Cardiologist (physician) and Advanced Practice Providers (APPs -  Physician Assistants and Nurse Practitioners) who all work together to provide you with the care you need, when you need it.  We recommend signing up for the  patient portal called "MyChart".  Sign up information is provided on this After Visit Summary.  MyChart is used to connect with patients for Virtual Visits (Telemedicine).  Patients are able to view lab/test results, encounter notes, upcoming appointments, etc.  Non-urgent messages can be sent to your provider as well.   To learn more about what you can do with MyChart, go to [email or web address].    Your next appointment:   12 month(s)  The format for your next appointment:   In Person  Provider:   You may see Sanda Klein, MD or one of the following Advanced Practice Providers on your designated Care Team:   Almyra Deforest, PA-C Fabian Sharp, [place] or  Roby Lofts, PA-C   Signed, Sanda Klein, MD  10/06/2020 12:14 PM    [place]

## 2021-02-10 DIAGNOSIS — M545 Low back pain, unspecified: Secondary | ICD-10-CM | POA: Diagnosis not present

## 2021-02-10 DIAGNOSIS — M5416 Radiculopathy, lumbar region: Secondary | ICD-10-CM | POA: Diagnosis not present

## 2021-02-10 DIAGNOSIS — Z1331 Encounter for screening for depression: Secondary | ICD-10-CM | POA: Diagnosis not present

## 2021-02-10 DIAGNOSIS — Z6834 Body mass index (BMI) 34.0-34.9, adult: Secondary | ICD-10-CM | POA: Diagnosis not present

## 2021-02-10 DIAGNOSIS — E6609 Other obesity due to excess calories: Secondary | ICD-10-CM | POA: Diagnosis not present

## 2021-07-25 DIAGNOSIS — D518 Other vitamin B12 deficiency anemias: Secondary | ICD-10-CM | POA: Diagnosis not present

## 2021-07-25 DIAGNOSIS — Z1331 Encounter for screening for depression: Secondary | ICD-10-CM | POA: Diagnosis not present

## 2021-07-25 DIAGNOSIS — E559 Vitamin D deficiency, unspecified: Secondary | ICD-10-CM | POA: Diagnosis not present

## 2021-07-25 DIAGNOSIS — Z0001 Encounter for general adult medical examination with abnormal findings: Secondary | ICD-10-CM | POA: Diagnosis not present

## 2021-07-25 DIAGNOSIS — N529 Male erectile dysfunction, unspecified: Secondary | ICD-10-CM | POA: Diagnosis not present

## 2021-07-25 DIAGNOSIS — E6609 Other obesity due to excess calories: Secondary | ICD-10-CM | POA: Diagnosis not present

## 2021-07-25 DIAGNOSIS — Z125 Encounter for screening for malignant neoplasm of prostate: Secondary | ICD-10-CM | POA: Diagnosis not present

## 2021-07-25 DIAGNOSIS — Z9229 Personal history of other drug therapy: Secondary | ICD-10-CM | POA: Diagnosis not present

## 2021-07-25 DIAGNOSIS — Z6833 Body mass index (BMI) 33.0-33.9, adult: Secondary | ICD-10-CM | POA: Diagnosis not present

## 2021-08-28 ENCOUNTER — Other Ambulatory Visit: Payer: Self-pay

## 2021-08-28 ENCOUNTER — Emergency Department (HOSPITAL_COMMUNITY): Payer: PPO

## 2021-08-28 ENCOUNTER — Emergency Department (HOSPITAL_COMMUNITY)
Admission: EM | Admit: 2021-08-28 | Discharge: 2021-08-28 | Disposition: A | Discharge status: Home / Self Care | Payer: PPO | Attending: Emergency Medicine | Admitting: Emergency Medicine

## 2021-08-28 ENCOUNTER — Encounter (HOSPITAL_COMMUNITY): Payer: Self-pay | Admitting: *Deleted

## 2021-08-28 DIAGNOSIS — R519 Headache, unspecified: Secondary | ICD-10-CM | POA: Diagnosis not present

## 2021-08-28 DIAGNOSIS — I10 Essential (primary) hypertension: Secondary | ICD-10-CM | POA: Insufficient documentation

## 2021-08-28 DIAGNOSIS — W57XXXA Bitten or stung by nonvenomous insect and other nonvenomous arthropods, initial encounter: Secondary | ICD-10-CM | POA: Insufficient documentation

## 2021-08-28 DIAGNOSIS — S70361A Insect bite (nonvenomous), right thigh, initial encounter: Secondary | ICD-10-CM | POA: Diagnosis not present

## 2021-08-28 DIAGNOSIS — M25512 Pain in left shoulder: Secondary | ICD-10-CM | POA: Diagnosis not present

## 2021-08-28 DIAGNOSIS — Z79899 Other long term (current) drug therapy: Secondary | ICD-10-CM | POA: Diagnosis not present

## 2021-08-28 DIAGNOSIS — R079 Chest pain, unspecified: Secondary | ICD-10-CM | POA: Diagnosis not present

## 2021-08-28 DIAGNOSIS — R7989 Other specified abnormal findings of blood chemistry: Secondary | ICD-10-CM | POA: Diagnosis not present

## 2021-08-28 DIAGNOSIS — I2510 Atherosclerotic heart disease of native coronary artery without angina pectoris: Secondary | ICD-10-CM | POA: Insufficient documentation

## 2021-08-28 DIAGNOSIS — Z7982 Long term (current) use of aspirin: Secondary | ICD-10-CM | POA: Insufficient documentation

## 2021-08-28 DIAGNOSIS — M25511 Pain in right shoulder: Secondary | ICD-10-CM | POA: Diagnosis not present

## 2021-08-28 LAB — CBC WITH DIFFERENTIAL/PLATELET
Abs Immature Granulocytes: 0.03 10*3/uL (ref 0.00–0.07)
Basophils Absolute: 0.1 10*3/uL (ref 0.0–0.1)
Basophils Relative: 1 %
Eosinophils Absolute: 0.5 10*3/uL (ref 0.0–0.5)
Eosinophils Relative: 6 %
HCT: 45.3 % (ref 39.0–52.0)
Hemoglobin: 15.5 g/dL (ref 13.0–17.0)
Immature Granulocytes: 0 %
Lymphocytes Relative: 23 %
Lymphs Abs: 1.9 10*3/uL (ref 0.7–4.0)
MCH: 30.3 pg (ref 26.0–34.0)
MCHC: 34.2 g/dL (ref 30.0–36.0)
MCV: 88.5 fL (ref 80.0–100.0)
Monocytes Absolute: 0.6 10*3/uL (ref 0.1–1.0)
Monocytes Relative: 8 %
Neutro Abs: 5.1 10*3/uL (ref 1.7–7.7)
Neutrophils Relative %: 62 %
Platelets: 243 10*3/uL (ref 150–400)
RBC: 5.12 MIL/uL (ref 4.22–5.81)
RDW: 12.1 % (ref 11.5–15.5)
WBC: 8.2 10*3/uL (ref 4.0–10.5)
nRBC: 0 % (ref 0.0–0.2)

## 2021-08-28 LAB — TROPONIN I (HIGH SENSITIVITY)
Troponin I (High Sensitivity): 5 ng/L (ref <18)
Troponin I (High Sensitivity): 6 ng/L (ref <18)

## 2021-08-28 LAB — BASIC METABOLIC PANEL
Anion gap: 7 (ref 5–15)
BUN: 16 mg/dL (ref 8–23)
CO2: 27 mmol/L (ref 22–32)
Calcium: 9.3 mg/dL (ref 8.9–10.3)
Chloride: 106 mmol/L (ref 98–111)
Creatinine, Ser: 1.49 mg/dL — ABNORMAL HIGH (ref 0.61–1.24)
GFR, Estimated: 50 mL/min — ABNORMAL LOW (ref >60)
Glucose, Bld: 95 mg/dL (ref 70–99)
Potassium: 4.2 mmol/L (ref 3.5–5.1)
Sodium: 140 mmol/L (ref 135–145)

## 2021-08-28 MED ORDER — DOXYCYCLINE HYCLATE 100 MG PO CAPS: 100.0000 mg | ORAL_CAPSULE | Freq: Two times a day (BID) | ORAL | 0 refills | Status: DC

## 2021-08-28 NOTE — ED Triage Notes (Addendum)
Pt with left shoulder pain x 2 hours.  Had right side pressure to head and behind eye with waking up, denies at present.  Took bp  at home was SBP 188.  Recent tick bite as well, removed last Monday.  Pt states similar symptoms with last tick bite and after starting antibiotics, symptoms resolved.

## 2021-08-28 NOTE — Discharge Instructions (Signed)
Your blood pressure today was slightly elevated.  I recommend that you follow-up with your cardiologist as you may need to have your blood pressure medication adjusted.  Please try to keep a log of your blood pressure readings at home.  Also, your kidney functions were slightly elevated today, continue to drink plenty of water this will need to be rechecked by your primary care provider in a week.  Take the antibiotic as directed until it is finished.  Return to the emergency department for any new or worsening symptoms.

## 2021-08-28 NOTE — ED Provider Notes (Signed)
[facility] EMERGENCY DEPARTMENT Provider Note   CSN: [id number] Arrival date & time: 08/28/21  1342     History  Chief Complaint  Patient presents with   Shoulder Pain    Christopher Obrien is a 71 y.o. male.   Shoulder Pain Associated symptoms: neck pain   Associated symptoms: no fever        Christopher Obrien is a 72 y.o. male with past medical history of coronary artery disease with NSTEMI in 2005, hypertension, hyperlipidemia who presents to the Emergency Department complaining of right-sided head pressure that began shortly after waking this morning.  Pain lasted approximately 1 hour then spontaneously resolved.  He describes the pain as a pressure along the right side of his head, behind his right ear and into his right temple and forehead.  Pain was not associated with neck pain or visual changes.  No reported dizziness.  Shortly after resolution of his headache he began having discomfort of his left scapular area.  He describes this as a aching pain and tingling that radiates to the base of his left neck.  Pain did not radiate into his arm and he denies any weakness of his extremities.  This too has also spontaneously resolved.  Current symptoms do not feel similar to prior MI.  Noted that his systolic blood pressure was 180 at home.  He denies any shortness of breath nausea or vomiting.  No diaphoresis.  Is also concerned about a tick bite to his right upper thigh that occurred 1 week ago.  He notes having myalgias from a previous tick bite that resolved after doxycycline.  He denies any fever or chills or rash at this time.    Home Medications Prior to Admission medications   Medication Sig Start Date End Date Taking? Authorizing Provider  aspirin 81 MG tablet Take 81 mg by mouth at bedtime.     [provider]  Cholecalciferol (VITAMIN D3) 125 MCG (5000 UT) CAPS Take 5,000 mg by mouth daily. 05/29/20   [provider]  lisinopril (ZESTRIL) 5 MG tablet Take 1 tablet  (5 mg total) by mouth daily. 10/05/20   Croitoru, Mihai, MD  Menaquinone-7 (VITAMIN K2 PO) Take 1 capsule by mouth daily.    [provider]  simvastatin (ZOCOR) 20 MG tablet Take 1 tablet (20 mg total) by mouth at bedtime. 10/05/20   Croitoru, Mihai, MD  Zinc 50 MG CAPS Take 50 mg by mouth daily. 05/29/20   [provider]      Allergies    Bee venom and Penicillins    Review of Systems   Review of Systems  Constitutional:  Negative for appetite change, chills and fever.  Eyes:  Negative for photophobia, pain and visual disturbance.  Respiratory:  Negative for cough, shortness of breath and wheezing.   Cardiovascular:  Negative for chest pain.  Gastrointestinal:  Negative for abdominal pain, nausea and vomiting.  Genitourinary:  Negative for dysuria and flank pain.  Musculoskeletal:  Positive for myalgias (left posterior shoulder pain) and neck pain. Negative for neck stiffness.  Skin:  Negative for color change.       Tick bite right upper thigh  Neurological:  Positive for headaches. Negative for syncope, facial asymmetry, speech difficulty, weakness and light-headedness.    Physical Exam Updated Vital Signs BP (!) 157/93 (BP Location: Right Arm)   Pulse 71   Temp 97.9 F (36.6 C) (Oral)   Resp 20   Ht '5\' 9"'$  (1.753 m)  Wt 102.1 kg   SpO2 98%   BMI 33.23 kg/m  Physical Exam Vitals and nursing note reviewed.  Constitutional:      General: He is not in acute distress.    Appearance: Normal appearance. He is not ill-appearing or toxic-appearing.  Eyes:     Extraocular Movements: Extraocular movements intact.     Conjunctiva/sclera: Conjunctivae normal.     Pupils: Pupils are equal, round, and reactive to light.  Cardiovascular:     Rate and Rhythm: Normal rate and regular rhythm.     Pulses: Normal pulses.  Pulmonary:     Effort: Pulmonary effort is normal.     Breath sounds: Normal breath sounds.  Chest:     Chest wall: No tenderness.  Abdominal:      Palpations: Abdomen is soft.     Tenderness: There is no abdominal tenderness.  Musculoskeletal:        General: Tenderness present. No swelling, deformity or signs of injury. Normal range of motion.     Cervical back: Normal range of motion. No tenderness.  Skin:    General: Skin is warm.     Capillary Refill: Capillary refill takes less than 2 seconds.     Comments: 1 cm area of erythema and healing skin to the medial aspect of the right upper thigh.  No surrounding erythema or central clearing.  No lymphangitis.  Neurological:     General: No focal deficit present.     Mental Status: He is alert.     Sensory: No sensory deficit.     Motor: No weakness.     ED Results / Procedures / Treatments   Labs (all labs ordered are listed, but only abnormal results are displayed) Labs Reviewed  BASIC METABOLIC PANEL - Abnormal; Notable for the following components:      Result Value   Creatinine, Ser 1.49 (*)    GFR, Estimated 50 (*)    All other components within normal limits  CBC WITH DIFFERENTIAL/PLATELET  TROPONIN I (HIGH SENSITIVITY)  TROPONIN I (HIGH SENSITIVITY)    EKG EKG Interpretation  Date/Time:  Monday August 28 2021 13:51:19 EDT Ventricular Rate:  73 PR Interval:  154 QRS Duration: 94 QT Interval:  376 QTC Calculation: 414 R Axis:   7 Text Interpretation: Normal sinus rhythm Inferior infarct , age undetermined Abnormal ECG Confirmed by Godfrey Pick [phone]) on 08/28/2021 2:53:18 PM  Radiology CT Head Wo Contrast  Result Date: 08/28/2021 CLINICAL DATA:  Headache. EXAM: CT HEAD WITHOUT CONTRAST TECHNIQUE: Contiguous axial images were obtained from the base of the skull through the vertex without intravenous contrast. RADIATION DOSE REDUCTION: This exam was performed according to the departmental dose-optimization program which includes automated exposure control, adjustment of the mA and/or kV according to patient size and/or use of iterative reconstruction technique.  COMPARISON:  None FINDINGS: Brain: No evidence of acute infarction, hemorrhage, hydrocephalus, extra-axial collection or mass lesion/mass effect. Vascular: No hyperdense vessel or unexpected calcification. Skull: Normal. Negative for fracture or focal lesion. Sinuses/Orbits: Mild mucosal thickening is noted involving the frontal sinus, ethmoid air cells and maxillary sinuses. No air-fluid levels identified. Mastoid air cells clear. Other: None. IMPRESSION: 1. No acute intracranial abnormalities. 2. Mild, chronic sinus inflammation. Electronically Signed   By: Kerby Moors M.D.   On: 08/28/2021 15:54   DG Chest 2 View  Result Date: 08/28/2021 CLINICAL DATA:  Provided history: Shoulder pain. Additional history provided: Pain in posterior shoulder and neck. Recent tick bite. EXAM: CHEST -  2 VIEW COMPARISON:  Prior chest radiographs 06/23/2020 and earlier. FINDINGS: Heart size at the upper limits of normal. No appreciable airspace consolidation or pulmonary edema. No evidence of pleural effusion or pneumothorax. Chronic elevation of the left hemidiaphragm. Degenerative changes of the spine. IMPRESSION: No evidence of acute cardiopulmonary abnormality. Chronic elevation of the left hemidiaphragm. Electronically Signed   By: Kellie Simmering D.O.   On: 08/28/2021 15:50   DG Shoulder Left  Result Date: 08/28/2021 CLINICAL DATA:  Acute left shoulder pain. EXAM: LEFT SHOULDER - 2+ VIEW COMPARISON:  None Available. FINDINGS: There is no evidence of fracture or dislocation. There is no evidence of arthropathy or other focal bone abnormality. Soft tissues are unremarkable. IMPRESSION: Negative. Electronically Signed   By: Marijo Conception M.D.   On: 08/28/2021 14:22    Procedures Procedures    Medications Ordered in ED Medications - No data to display  ED Course/ Medical Decision Making/ A&P                           Medical Decision Making Patient here for evaluation of right-sided headache shortly after  waking this morning.  Headaches spontaneously resolved.  Sometime after the headache went away he developed achy pains of his left posterior shoulder area.  Asymptomatic upon ER arrival.  No visual changes, diaphoresis, nausea chest pain or shortness of breath.  Prior NSTEMI in 2005 but current symptoms did not feel similar per patient.  He was noted to be mildly hypertensive on arrival.  Takes 5 mg lisinopril daily  On exam, patient well-appearing nontoxic.  Mildly hypertensive but remaining vital signs reassuring.  No focal neurodeficits on exam.  No meningeal signs.  Does have healing wound of the right upper thigh that is consistent with recent tick bite.  There is no target lesions or surrounding erythema.  Patient's differential diagnosis would include TIA, ACS, atypical migraine, symptoms secondary to tickborne illness.  Amount and/or Complexity of Data Reviewed Labs: ordered.    Details: Labs interpreted by me, no evidence of leukocytosis, hemoglobin reassuring.  Chemistries show slightly elevated serum creatinine at 1.49.  His creatinine was within normal limits 10 months ago.  No recent values available for comparison.  Delta troponin remains flat Radiology: ordered.    Details: X-ray of the left shoulder negative, chest x-ray without evidence of acute cardiopulmonary abnormality.  He has a chronically elevated left hemidiaphragm.  CT head without acute intracranial abnormalities. ECG/medicine tests: ordered.    Details: EKG read by Dr. Doren Custard, showing normal sinus rhythm Discussion of management or test interpretation with external provider(s): On recheck, patient resting comfortably.  Denies symptoms at this time.  Work-up reassuring.  He does have a mildly elevated serum creatinine.  I do not have recent labs for comparison.  I have discussed importance of close follow-up regarding this with his PCP and he is agreeable to this plan.  Source of patient's symptoms unclear at this time, possibly  related to his elevated blood pressure, he is on a low-dose lisinopril.  He is closely followed by cardiology and he is agreeable to follow-up with them as his antihypertensive medication may need to be increased.  I have recommended that he keep a blood pressure log.  We will treat with doxycycline for his recent tick bite.  I feel he is appropriate for discharge home, strict return precautions were discussed and all questions were answered.  Final Clinical Impression(s) / ED Diagnoses Final diagnoses:  Acute pain of left shoulder  Acute nonintractable headache, unspecified headache type  Tick bite of right thigh, initial encounter    Rx / DC Orders ED Discharge Orders     None         Kem Parkinson, PA-C 08/28/21 1842    Milton Ferguson, MD 08/29/21 1016

## 2021-09-08 ENCOUNTER — Encounter: Payer: Self-pay | Admitting: Cardiovascular Disease

## 2021-09-08 NOTE — Telephone Encounter (Signed)
Please have him increase the lisinopril to 10 mg daily until his follow up appt on the 22nd. He can take 2 of the 5 mg tabs at the same time (this is still a small dose of lisinopril, goes to 40 mg daily)

## 2021-09-18 ENCOUNTER — Ambulatory Visit: Payer: PPO | Admitting: Cardiovascular Disease

## 2021-09-19 ENCOUNTER — Ambulatory Visit: Payer: PPO | Admitting: Cardiovascular Disease

## 2021-09-19 ENCOUNTER — Encounter: Payer: Self-pay | Admitting: Cardiovascular Disease

## 2021-09-19 VITALS — BP 118/68 | HR 77 | Ht 70.0 in | Wt 227.6 lb

## 2021-09-19 DIAGNOSIS — I10 Essential (primary) hypertension: Secondary | ICD-10-CM

## 2021-09-19 DIAGNOSIS — E785 Hyperlipidemia, unspecified: Secondary | ICD-10-CM | POA: Diagnosis not present

## 2021-09-19 DIAGNOSIS — I2510 Atherosclerotic heart disease of native coronary artery without angina pectoris: Secondary | ICD-10-CM | POA: Diagnosis not present

## 2021-09-19 DIAGNOSIS — E66811 Obesity, class 1: Secondary | ICD-10-CM

## 2021-09-19 DIAGNOSIS — I358 Other nonrheumatic aortic valve disorders: Secondary | ICD-10-CM

## 2021-09-19 DIAGNOSIS — E669 Obesity, unspecified: Secondary | ICD-10-CM

## 2021-09-19 NOTE — Patient Instructions (Signed)

## 2021-09-19 NOTE — Progress Notes (Signed)
Cardiology office note   Date:  09/21/2021   ID:  CASH DUCE, DOB 05-28-1950, MRN [id number]  PCP:  Redmond School, MD  Cardiologist:  Sanda Klein, MD  Electrophysiologist:  None   Evaluation Performed:  Follow-Up Visit  Chief Complaint: CAD; follow-up after ED visit  History of Present Illness:    Christopher Obrien is a 71 y.o. male with obesity, hypertension and dyslipidemia (low HDL, increased LDL) and a remote history of non-STEMI with stent to the oblique marginal artery in 2005, but without subsequent coronary or vascular events.  He had a tick bite in late May and then in June while working outside felt very ill with generalized muscle aches.  He was treated with doxycycline with rapid improvement.  Towards the end of June he had COVID-19 infection, that resolved spontaneously without serious symptoms.  He has been trying to become more fit and lose weight and is going to the gym 3-4 times a week doing cardio 20 minutes at 3.2-3.5 miles an hour on the treadmill and weight machines.  He's lost 6 pounds.  He does not have any trouble with chest pain while exercising at the gym.  He had another couple of tick bites in July.  Not long after that he felt very sick.  He had severe pressure in his left shoulder radiating up his left neck and also had a tension-like headache of his right occipital area.  Retrospectively, his symptoms sound musculoskeletal.  He went to the emergency room 08/28/2021.  His blood pressure was 188/90 mmHg.  Work-up was unremarkable.  ECG showed normal sinus rhythm without ischemic changes and cardiac enzymes were normal.  Creatinine was elevated suggesting that he was dehydrated at the time.  He got better while in the emergency room.  He has not had any recurrence of those complaints  The patient specifically denies any current problems with chest pain, dyspnea at rest or with exertion, orthopnea, paroxysmal nocturnal dyspnea, syncope, palpitations, focal  neurological deficits, intermittent claudication, lower extremity edema, unexplained weight gain, cough, hemoptysis or wheezing.  In October 2019 on a Bruce protocol treadmill test he exercised for 11 minutes/13.4 METs without angina or ischemic ECG changes.   Past Medical History:  Diagnosis Date   CAD (coronary artery disease)    Hyperlipidemia    NSTEMI (non-ST elevated myocardial infarction) ([place]) 2005   Systemic hypertension    Past Surgical History:  Procedure Laterality Date   APPENDECTOMY  1995   CHOLECYSTECTOMY     COLONOSCOPY N/A 01/12/2013   Procedure: COLONOSCOPY;  Surgeon: Danie Binder, MD;  [facility];  Service: Endoscopy;  Laterality: N/A;  10:30    CORONARY ANGIOPLASTY WITH STENT PLACEMENT  07/09/2003   bare metal stent to the first oblique marginal artery   NM MYOCAR PERF WALL MOTION  03/15/2008   normal     Current Meds  Medication Sig   aspirin 81 MG tablet Take 81 mg by mouth at bedtime.    lisinopril (ZESTRIL) 5 MG tablet Take 1 tablet (5 mg total) by mouth daily.   simvastatin (ZOCOR) 20 MG tablet Take 1 tablet (20 mg total) by mouth at bedtime.     Allergies:   Bee venom and Penicillins   Social History   Tobacco Use   Smoking status: Never   Smokeless tobacco: Never  Vaping Use   Vaping Use: Never used  Substance Use Topics   Alcohol use: No   Drug use: No  Family Hx: The patient's family history is not on file. He was adopted.  ROS:   Please see the history of present illness.     All other systems reviewed and are negative.   Prior CV studies:   The following studies were reviewed today: Treadmill stress test October 2019  Labs/Other Tests and Data Reviewed:    Echocardiogram 2015   - Left ventricle: The cavity size was normal. There was mild focal    basal hypertrophy of the septum. Systolic function was normal.    The estimated ejection fraction was in the range of 55% to 60%.    Wall motion was normal; there  were no regional wall motion    abnormalities. Doppler parameters are consistent with abnormal    left ventricular relaxation (grade 1 diastolic dysfunction). The    E/e&' ratio is between 8-15, suggesting indeterminate LV filling    pressure.  - Aortic valve: Sclerosis without stenosis. There was mild    regurgitation.  - Mitral valve: Calcified annulus. Mildly thickened leaflets .    There was mild regurgitation.  - Left atrium: LA volume/ BSA = 26.9 ml/m2.  EKG: Ordered today shows normal sinus rhythm, borderline interventricular conduction delay (QRS 100 ms), inferior Q waves (old), QTC 400 ms Recent Labs: 10/05/2020: ALT 26 08/28/2021: BUN 16; Creatinine, Ser 1.49; Hemoglobin 15.5; Platelets 243; Potassium 4.2; Sodium 140   Recent Lipid Panel Lab Results  Component Value Date/Time   CHOL 130 10/05/2020 09:15 AM   TRIG 100 10/05/2020 09:15 AM   HDL 41 10/05/2020 09:15 AM   CHOLHDL 3.2 10/05/2020 09:15 AM   CHOLHDL 2.5 11/21/2015 10:45 AM   LDLCALC 70 10/05/2020 09:15 AM   03/04/2019 Total cholesterol 113, HDL 45, LDL 51, triglycerides 85 Hemoglobin 15.8, creatinine 1.14, potassium 5.6, liver function tests Wt Readings from Last 3 Encounters:  09/19/21 227 lb 9.6 oz (103.2 kg)  08/28/21 225 lb (102.1 kg)  10/05/20 221 lb (100.2 kg)     Objective:    Vital Signs:  BP 118/68 (BP Location: Left Arm, Patient Position: Sitting, Cuff Size: Large)   Pulse 77   Ht '5\' 10"'$  (1.778 m)   Wt 227 lb 9.6 oz (103.2 kg)   SpO2 94%   BMI 32.66 kg/m     General: Alert, oriented x3, no distress, mildly obese Head: no evidence of trauma, PERRL, EOMI, no exophtalmos or lid lag, no myxedema, no xanthelasma; normal ears, nose and oropharynx Neck: normal jugular venous pulsations and no hepatojugular reflux; brisk carotid pulses without delay and no carotid bruits Chest: clear to auscultation, no signs of consolidation by percussion or palpation, normal fremitus, symmetrical and full respiratory  excursions Cardiovascular: normal position and quality of the apical impulse, regular rhythm, normal first and second heart sounds, 2/6 early peaking aortic ejection murmur no diastolic murmurs, rubs or gallops Abdomen: no tenderness or distention, no masses by palpation, no abnormal pulsatility or arterial bruits, normal bowel sounds, no hepatosplenomegaly Extremities: no clubbing, cyanosis or edema; 2+ radial, ulnar and brachial pulses bilaterally; 2+ right femoral, posterior tibial and dorsalis pedis pulses; 2+ left femoral, posterior tibial and dorsalis pedis pulses; no subclavian or femoral bruits Neurological: grossly nonfocal Psych: Normal mood and affect    ASSESSMENT & PLAN:    1. Coronary artery disease involving native coronary artery of native heart without angina pectoris   2. Dyslipidemia (high LDL; low HDL)   3. Essential hypertension   4. Obesity (BMI 30.0-34.9)   5. Aortic  valve sclerosis      CAD: Asymptomatic despite very active lifestyle.  Recent episode of chest pain sounds musculoskeletal in etiology. HLP: All lipid parameters acceptable.  HDL remains borderline low. HTN: Well-controlled.  Previous issues with hyperkalemia seem to have been artifactual. Obesity: Congratulated him on his commitment to a healthy diet and regular exercise.  Continue going to the gym. Aortic valve sclerosis: He does not have any symptoms of exertional angina/dyspnea/syncope.  Echo in 2015 showed sclerosis without stenosis.  May be worthwhile repeating his echocardiogram in the near future.  Patient Instructions  Medication Instructions:  No changes *If you need a refill on your cardiac medications before your next appointment, please call your pharmacy*   Lab Work: None ordered If you have labs (blood work) drawn today and your tests are completely normal, you will receive your results only by: [place] (if you have MyChart) OR A paper copy in the mail If you have any lab  test that is abnormal or we need to change your treatment, we will call you to review the results.   Testing/Procedures: None ordered   Follow-Up: At [facility], you and your health needs are our priority.  As part of our continuing mission to provide you with exceptional heart care, we have created designated Provider Care Teams.  These Care Teams include your primary Cardiologist (physician) and Advanced Practice Providers (APPs -  Physician Assistants and Nurse Practitioners) who all work together to provide you with the care you need, when you need it.  We recommend signing up for the patient portal called "MyChart".  Sign up information is provided on this After Visit Summary.  MyChart is used to connect with patients for Virtual Visits (Telemedicine).  Patients are able to view lab/test results, encounter notes, upcoming appointments, etc.  Non-urgent messages can be sent to your provider as well.   To learn more about what you can do with MyChart, go to [email or web address].    Your next appointment:   12 month(s)  The format for your next appointment:   In Person  Provider:   Sanda Klein, MD {   Important Information About Sugar        Signed, Sanda Klein, MD  09/21/2021 4:37 PM    [place]

## 2021-10-03 ENCOUNTER — Encounter: Payer: Self-pay | Admitting: Cardiovascular Disease

## 2021-10-03 MED ORDER — LISINOPRIL 10 MG PO TABS: 10.0000 mg | ORAL_TABLET | Freq: Every day | ORAL | 3 refills | Status: DC

## 2021-10-27 MED ORDER — SIMVASTATIN 20 MG PO TABS: 20.0000 mg | ORAL_TABLET | Freq: Every day | ORAL | 3 refills | Status: DC

## 2021-10-27 NOTE — Addendum Note (Signed)
Addended by: Raiford Simmonds on: 10/27/2021 11:19 AM   Modules accepted: Orders

## 2022-01-12 DIAGNOSIS — U071 COVID-19: Secondary | ICD-10-CM | POA: Diagnosis not present

## 2022-02-14 DIAGNOSIS — X32XXXD Exposure to sunlight, subsequent encounter: Secondary | ICD-10-CM | POA: Diagnosis not present

## 2022-02-14 DIAGNOSIS — L820 Inflamed seborrheic keratosis: Secondary | ICD-10-CM | POA: Diagnosis not present

## 2022-02-14 DIAGNOSIS — L570 Actinic keratosis: Secondary | ICD-10-CM | POA: Diagnosis not present

## 2022-02-14 DIAGNOSIS — Z1283 Encounter for screening for malignant neoplasm of skin: Secondary | ICD-10-CM | POA: Diagnosis not present

## 2022-02-14 DIAGNOSIS — D225 Melanocytic nevi of trunk: Secondary | ICD-10-CM | POA: Diagnosis not present

## 2022-06-04 DIAGNOSIS — E6609 Other obesity due to excess calories: Secondary | ICD-10-CM | POA: Diagnosis not present

## 2022-06-04 DIAGNOSIS — Z6833 Body mass index (BMI) 33.0-33.9, adult: Secondary | ICD-10-CM | POA: Diagnosis not present

## 2022-06-04 DIAGNOSIS — J0100 Acute maxillary sinusitis, unspecified: Secondary | ICD-10-CM | POA: Diagnosis not present

## 2022-06-04 DIAGNOSIS — W57XXXA Bitten or stung by nonvenomous insect and other nonvenomous arthropods, initial encounter: Secondary | ICD-10-CM | POA: Diagnosis not present

## 2022-07-05 DIAGNOSIS — Z6833 Body mass index (BMI) 33.0-33.9, adult: Secondary | ICD-10-CM | POA: Diagnosis not present

## 2022-07-05 DIAGNOSIS — I10 Essential (primary) hypertension: Secondary | ICD-10-CM | POA: Diagnosis not present

## 2022-07-05 DIAGNOSIS — Z1331 Encounter for screening for depression: Secondary | ICD-10-CM | POA: Diagnosis not present

## 2022-07-05 DIAGNOSIS — E6609 Other obesity due to excess calories: Secondary | ICD-10-CM | POA: Diagnosis not present

## 2022-07-05 DIAGNOSIS — Z0001 Encounter for general adult medical examination with abnormal findings: Secondary | ICD-10-CM | POA: Diagnosis not present

## 2022-07-05 DIAGNOSIS — Z9229 Personal history of other drug therapy: Secondary | ICD-10-CM | POA: Diagnosis not present

## 2022-07-05 DIAGNOSIS — I358 Other nonrheumatic aortic valve disorders: Secondary | ICD-10-CM | POA: Diagnosis not present

## 2022-07-06 DIAGNOSIS — Z125 Encounter for screening for malignant neoplasm of prostate: Secondary | ICD-10-CM | POA: Diagnosis not present

## 2022-07-06 DIAGNOSIS — D518 Other vitamin B12 deficiency anemias: Secondary | ICD-10-CM | POA: Diagnosis not present

## 2022-07-06 DIAGNOSIS — E559 Vitamin D deficiency, unspecified: Secondary | ICD-10-CM | POA: Diagnosis not present

## 2022-07-06 DIAGNOSIS — R7309 Other abnormal glucose: Secondary | ICD-10-CM | POA: Diagnosis not present

## 2022-07-06 DIAGNOSIS — G9332 Myalgic encephalomyelitis/chronic fatigue syndrome: Secondary | ICD-10-CM | POA: Diagnosis not present

## 2022-07-06 DIAGNOSIS — Z0001 Encounter for general adult medical examination with abnormal findings: Secondary | ICD-10-CM | POA: Diagnosis not present

## 2022-07-12 ENCOUNTER — Other Ambulatory Visit: Payer: Self-pay

## 2022-07-12 MED ORDER — LISINOPRIL 10 MG PO TABS: 10.0000 mg | ORAL_TABLET | Freq: Every day | ORAL | 0 refills | Status: DC

## 2022-08-09 DIAGNOSIS — R7309 Other abnormal glucose: Secondary | ICD-10-CM | POA: Diagnosis not present

## 2022-09-27 ENCOUNTER — Other Ambulatory Visit (HOSPITAL_COMMUNITY): Payer: Self-pay

## 2022-09-28 ENCOUNTER — Other Ambulatory Visit (HOSPITAL_COMMUNITY): Payer: Self-pay

## 2022-12-14 ENCOUNTER — Encounter: Payer: Self-pay | Admitting: *Deleted

## 2022-12-17 ENCOUNTER — Ambulatory Visit: Payer: PPO | Attending: Cardiovascular Disease | Admitting: Cardiovascular Disease

## 2022-12-17 ENCOUNTER — Encounter: Payer: Self-pay | Admitting: Cardiovascular Disease

## 2022-12-17 VITALS — BP 120/80 | HR 71 | Ht 68.0 in | Wt 211.6 lb

## 2022-12-17 DIAGNOSIS — I10 Essential (primary) hypertension: Secondary | ICD-10-CM

## 2022-12-17 DIAGNOSIS — I2510 Atherosclerotic heart disease of native coronary artery without angina pectoris: Secondary | ICD-10-CM | POA: Diagnosis not present

## 2022-12-17 DIAGNOSIS — E66811 Obesity, class 1: Secondary | ICD-10-CM

## 2022-12-17 DIAGNOSIS — I358 Other nonrheumatic aortic valve disorders: Secondary | ICD-10-CM | POA: Diagnosis not present

## 2022-12-17 DIAGNOSIS — E785 Hyperlipidemia, unspecified: Secondary | ICD-10-CM | POA: Diagnosis not present

## 2022-12-17 DIAGNOSIS — D72829 Elevated white blood cell count, unspecified: Secondary | ICD-10-CM | POA: Diagnosis not present

## 2022-12-17 MED ORDER — LISINOPRIL 5 MG PO TABS: 5.0000 mg | ORAL_TABLET | Freq: Every day | ORAL | 3 refills | Status: DC

## 2022-12-17 NOTE — Progress Notes (Signed)
Cardiology office note   Date:  12/17/2022   ID:  Christopher Obrien, DOB 1950-08-15, MRN [id number]  PCP:  Elfredia Nevins, MD  Cardiologist:  Thurmon Fair, MD  Electrophysiologist:  None   Evaluation Performed:  Follow-Up Visit  Chief Complaint: CAD  History of Present Illness:    Christopher Obrien is a 72 y.o. male with obesity, hypertension and dyslipidemia (low HDL, increased LDL) and a remote history of non-STEMI with stent to the oblique marginal artery in 2005, but without subsequent coronary or vascular events.  Is generally feeling very well.  Musaab and his wife have decided to lose weight by improving her diet and it is working.  He is lost 15 pounds since June.  He is also exercising at the gym 5 days a week.  His goal is to get down to 200 pounds.  He has not had any problems with shortness of breath or chest pain at rest or with activity and does not have lower extremity edema, intermittent claudication, focal neurological complaints, palpitations, syncope.  He does occasionally have orthostatic dizziness.  Typically at the gym he is doing cardio 20 minutes at 3.2-3.5 miles an hour on the treadmill and then uses weight machines.    In October 2019 on a Bruce protocol treadmill test he exercised for 11 minutes/13.4 METs without angina or ischemic ECG changes.   Past Medical History:  Diagnosis Date   CAD (coronary artery disease)    Hyperlipidemia    NSTEMI (non-ST elevated myocardial infarction) ([facility]) 2005   Systemic hypertension    Past Surgical History:  Procedure Laterality Date   APPENDECTOMY  1995   CHOLECYSTECTOMY     COLONOSCOPY N/A 01/12/2013   Procedure: COLONOSCOPY;  Surgeon: West Bali, MD;  Location[facility];  Service: Endoscopy;  Laterality: N/A;  10:30    CORONARY ANGIOPLASTY WITH STENT PLACEMENT  07/09/2003   bare metal stent to the first oblique marginal artery   NM MYOCAR PERF WALL MOTION  03/15/2008   normal     Current Meds  Medication Sig    aspirin 81 MG tablet Take 81 mg by mouth at bedtime.    CALCIUM PO Take 1,000 mg by mouth daily at 6 (six) AM.   Coenzyme Q10 (CO Q 10 PO) Take 100 mg by mouth daily at 6 (six) AM.   Multiple Vitamins-Minerals (ONE DAILY 50 PLUS PO) Take 1 tablet by mouth daily at 6 (six) AM.   simvastatin (ZOCOR) 20 MG tablet Take 1 tablet (20 mg total) by mouth at bedtime.   [DISCONTINUED] lisinopril (ZESTRIL) 10 MG tablet Take 1 tablet (10 mg total) by mouth daily.     Allergies:   Bee venom and Penicillins   Social History   Tobacco Use   Smoking status: Never   Smokeless tobacco: Never  Vaping Use   Vaping status: Never Used  Substance Use Topics   Alcohol use: No   Drug use: No     Family Hx: The patient's family history is not on file. He was adopted.  ROS:   Please see the history of present illness.     All other systems reviewed and are negative.   Prior CV studies:   The following studies were reviewed today: Treadmill stress test October 2019  Labs/Other Tests and Data Reviewed:    Echocardiogram 2015   - Left ventricle: The cavity size was normal. There was mild focal    basal hypertrophy of the septum.  Systolic function was normal.    The estimated ejection fraction was in the range of 55% to 60%.    Wall motion was normal; there were no regional wall motion    abnormalities. Doppler parameters are consistent with abnormal    left ventricular relaxation (grade 1 diastolic dysfunction). The    E/e&' ratio is between 8-15, suggesting indeterminate LV filling    pressure.  - Aortic valve: Sclerosis without stenosis. There was mild    regurgitation.  - Mitral valve: Calcified annulus. Mildly thickened leaflets .    There was mild regurgitation.  - Left atrium: LA volume/ BSA = 26.9 ml/m2.  EKG: Ordered today shows normal sinus rhythm, borderline interventricular conduction delay (QRS 100 ms), inferior Q waves (old), QTC 400 ms Recent Labs: No results found for  requested labs within last 365 days.   Recent Lipid Panel Lab Results  Component Value Date/Time   CHOL 130 10/05/2020 09:15 AM   TRIG 100 10/05/2020 09:15 AM   HDL 41 10/05/2020 09:15 AM   CHOLHDL 3.2 10/05/2020 09:15 AM   CHOLHDL 2.5 11/21/2015 10:45 AM   LDLCALC 70 10/05/2020 09:15 AM   03/04/2019 Total cholesterol 113, HDL 45, LDL 51, triglycerides 85 Hemoglobin 15.8, creatinine 1.14, potassium 5.6, liver function tests Wt Readings from Last 3 Encounters:  12/17/22 211 lb 9.6 oz (96 kg)  09/19/21 227 lb 9.6 oz (103.2 kg)  08/28/21 225 lb (102.1 kg)     Objective:    Vital Signs:  BP 120/80 (BP Location: Left Arm, Patient Position: Sitting, Cuff Size: Normal)   Pulse 71   Ht 5\' 8"  (1.727 m)   Wt 211 lb 9.6 oz (96 kg)   SpO2 96%   BMI 32.17 kg/m     General: Alert, oriented x3, no distress, mildly obese Head: no evidence of trauma, PERRL, EOMI, no exophtalmos or lid lag, no myxedema, no xanthelasma; normal ears, nose and oropharynx Neck: normal jugular venous pulsations and no hepatojugular reflux; brisk carotid pulses without delay and no carotid bruits Chest: clear to auscultation, no signs of consolidation by percussion or palpation, normal fremitus, symmetrical and full respiratory excursions Cardiovascular: normal position and quality of the apical impulse, regular rhythm, normal first and second heart sounds, 1-2/6 early peaking aortic ejection murmur no diastolic murmurs, rubs or gallops Abdomen: no tenderness or distention, no masses by palpation, no abnormal pulsatility or arterial bruits, normal bowel sounds, no hepatosplenomegaly Extremities: no clubbing, cyanosis or edema; 2+ radial, ulnar and brachial pulses bilaterally; 2+ right femoral, posterior tibial and dorsalis pedis pulses; 2+ left femoral, posterior tibial and dorsalis pedis pulses; no subclavian or femoral bruits Neurological: grossly nonfocal Psych: Normal mood and affect    ASSESSMENT & PLAN:     1. Coronary artery disease involving native coronary artery of native heart without angina pectoris   2. Primary hypertension   3. Dyslipidemia (high LDL; low HDL)   4. Obesity (BMI 30.0-34.9)   5. Aortic valve sclerosis      CAD: Symptomatic despite being very physically active. HLP: Chronically low HDL cholesterol which might improve a little bit with weight loss.  Otherwise all lipid parameters are in desirable range. HTN: Very well controlled, with weight loss he is developed some orthostatic dizziness.  Reduce lisinopril to 5 mg daily. Obesity: He is doing an excellent job with healthy diet and weight loss.  Encourage him to continue exercising and eating a healthy diet. Aortic valve sclerosis: He does not have any symptoms of  exertional angina/dyspnea/syncope.  Echo in 2015 showed sclerosis without stenosis.  Murmur is even less impressive this year.  Will put off doing the echocardiogram for another year.  Patient Instructions  Medication Instructions:  Decrease Lisinopril to 5 mg daily *If you need a refill on your cardiac medications before your next appointment, please call your pharmacy*  Follow-Up: At [facility], you and your health needs are our priority.  As part of our continuing mission to provide you with exceptional heart care, we have created designated Provider Care Teams.  These Care Teams include your primary Cardiologist (physician) and Advanced Practice Providers (APPs -  Physician Assistants and Nurse Practitioners) who all work together to provide you with the care you need, when you need it.  We recommend signing up for the patient portal called "MyChart".  Sign up information is provided on this After Visit Summary.  MyChart is used to connect with patients for Virtual Visits (Telemedicine).  Patients are able to view lab/test results, encounter notes, upcoming appointments, etc.  Non-urgent messages can be sent to your provider as well.   To learn  more about what you can do with MyChart, go to [email or web address].    Your next appointment:   1 year(s)  Provider:   Thurmon Fair, MD      Signed, Thurmon Fair, MD  12/17/2022 11:06 AM    [facility]

## 2022-12-17 NOTE — Patient Instructions (Signed)
Medication Instructions:  Decrease Lisinopril to 5 mg daily *If you need a refill on your cardiac medications before your next appointment, please call your pharmacy*  Follow-Up: At [facility], you and your health needs are our priority.  As part of our continuing mission to provide you with exceptional heart care, we have created designated Provider Care Teams.  These Care Teams include your primary Cardiologist (physician) and Advanced Practice Providers (APPs -  Physician Assistants and Nurse Practitioners) who all work together to provide you with the care you need, when you need it.  We recommend signing up for the patient portal called "MyChart".  Sign up information is provided on this After Visit Summary.  MyChart is used to connect with patients for Virtual Visits (Telemedicine).  Patients are able to view lab/test results, encounter notes, upcoming appointments, etc.  Non-urgent messages can be sent to your provider as well.   To learn more about what you can do with MyChart, go to [email or web address].    Your next appointment:   1 year(s)  Provider:   Thurmon Fair, MD

## 2022-12-26 ENCOUNTER — Telehealth: Payer: Self-pay | Admitting: Cardiovascular Disease

## 2022-12-26 MED ORDER — SIMVASTATIN 20 MG PO TABS: 20.0000 mg | ORAL_TABLET | Freq: Every day | ORAL | 3 refills | Status: DC

## 2022-12-26 NOTE — Telephone Encounter (Signed)
*  STAT* If patient is at the pharmacy, call can be transferred to refill team.   1. Which medications need to be refilled? (please list name of each medication and dose if known) simvastatin (ZOCOR) 20 MG tablet  2. Which pharmacy/location (including street and city if local pharmacy) is medication to be sent to? [facility] [employer], [place] - [address] Phone: [phone]  Fax: [phone]     3. Do they need a 30 day or 90 day supply? 90

## 2023-02-21 DIAGNOSIS — L570 Actinic keratosis: Secondary | ICD-10-CM | POA: Diagnosis not present

## 2023-02-21 DIAGNOSIS — Z1283 Encounter for screening for malignant neoplasm of skin: Secondary | ICD-10-CM | POA: Diagnosis not present

## 2023-02-21 DIAGNOSIS — X32XXXD Exposure to sunlight, subsequent encounter: Secondary | ICD-10-CM | POA: Diagnosis not present

## 2023-02-21 DIAGNOSIS — D225 Melanocytic nevi of trunk: Secondary | ICD-10-CM | POA: Diagnosis not present

## 2023-04-03 DIAGNOSIS — E559 Vitamin D deficiency, unspecified: Secondary | ICD-10-CM | POA: Diagnosis not present

## 2023-04-03 DIAGNOSIS — Z0001 Encounter for general adult medical examination with abnormal findings: Secondary | ICD-10-CM | POA: Diagnosis not present

## 2023-04-03 DIAGNOSIS — I358 Other nonrheumatic aortic valve disorders: Secondary | ICD-10-CM | POA: Diagnosis not present

## 2023-04-03 DIAGNOSIS — Z125 Encounter for screening for malignant neoplasm of prostate: Secondary | ICD-10-CM | POA: Diagnosis not present

## 2023-04-03 DIAGNOSIS — E6609 Other obesity due to excess calories: Secondary | ICD-10-CM | POA: Diagnosis not present

## 2023-04-03 DIAGNOSIS — I10 Essential (primary) hypertension: Secondary | ICD-10-CM | POA: Diagnosis not present

## 2023-04-03 DIAGNOSIS — D518 Other vitamin B12 deficiency anemias: Secondary | ICD-10-CM | POA: Diagnosis not present

## 2023-04-03 DIAGNOSIS — Z6829 Body mass index (BMI) 29.0-29.9, adult: Secondary | ICD-10-CM | POA: Diagnosis not present

## 2023-04-03 DIAGNOSIS — Z1331 Encounter for screening for depression: Secondary | ICD-10-CM | POA: Diagnosis not present

## 2023-04-10 ENCOUNTER — Encounter: Payer: Self-pay | Admitting: *Deleted

## 2023-05-01 ENCOUNTER — Telehealth: Payer: Self-pay | Admitting: *Deleted

## 2023-05-01 NOTE — Telephone Encounter (Signed)
  Procedure: colonoscopy  Height: 5'10" Weight: 202 lb       Have you had a colonoscopy before?  Yes, 01/12/13, Dr.Fields  Do you have family history of colon cancer?  Pt is adopted  Do you have a family history of polyps? Pt is adopted  Previous colonoscopy with polyps removed? no  Do you have a history colorectal cancer?   no  Are you diabetic?  no  Do you have a prosthetic or mechanical heart valve? no  Do you have a pacemaker/defibrillator?   no  Have you had endocarditis/atrial fibrillation?  no  Do you use supplemental oxygen/CPAP?  no  Have you had joint replacement within the last 12 months?  no  Do you tend to be constipated or have to use laxatives?  no   Do you have history of alcohol use? If yes, how much and how often.  no  Do you have history or are you using drugs? If yes, what do are you  using?  no  Have you ever had a stroke/heart attack?  Yes, heart attack 2005  Have you ever had a heart or other vascular stent placed,?yes, 2005  Do you take weight loss medication? no  Do you take any blood-thinning medications such as: (Plavix, aspirin, Coumadin, Aggrenox, Brilinta, Xarelto, Eliquis, Pradaxa, Savaysa or Effient)? aspirin  If yes we need the name, milligram, dosage and who is prescribing doctor:  81 mg             Current Outpatient Medications  Medication Sig Dispense Refill   aspirin 81 MG tablet Take 81 mg by mouth at bedtime.      Cholecalciferol (D3 ADULT PO) Take 1,000 Int'l Units/kg by mouth daily.     lisinopril (ZESTRIL) 5 MG tablet Take 1 tablet (5 mg total) by mouth daily. 90 tablet 3   Multiple Vitamins-Minerals (ONE DAILY 50 PLUS PO) Take 1 tablet by mouth daily at 6 (six) AM.     simvastatin (ZOCOR) 20 MG tablet Take 1 tablet (20 mg total) by mouth at bedtime. 90 tablet 3   CALCIUM PO Take 1,000 mg by mouth daily at 6 (six) AM. (Patient not taking: Reported on 05/01/2023)     Coenzyme Q10 (CO Q 10 PO) Take 100 mg by mouth daily at 6  (six) AM. (Patient not taking: Reported on 05/01/2023)     No current facility-administered medications for this visit.    Allergies  Allergen Reactions   Bee Venom Hives, Itching and Swelling    Swelling to lips, eyes, ears, and possibly throat   Penicillins Rash    Has patient had a PCN reaction causing immediate rash, facial/tongue/throat swelling, SOB or lightheadedness with hypotension: No Has patient had a PCN reaction causing severe rash involving mucus membranes or skin necrosis: No Has patient had a PCN reaction that required hospitalization: No Has patient had a PCN reaction occurring within the last 10 years: No If all of the above answers are "NO", then may proceed with Cephalosporin use.

## 2023-06-02 NOTE — Telephone Encounter (Signed)
 Due to cardiac history, ASA 3 and will need OV.

## 2023-06-04 DIAGNOSIS — Z6830 Body mass index (BMI) 30.0-30.9, adult: Secondary | ICD-10-CM | POA: Diagnosis not present

## 2023-06-04 DIAGNOSIS — W57XXXA Bitten or stung by nonvenomous insect and other nonvenomous arthropods, initial encounter: Secondary | ICD-10-CM | POA: Diagnosis not present

## 2023-06-04 DIAGNOSIS — K4090 Unilateral inguinal hernia, without obstruction or gangrene, not specified as recurrent: Secondary | ICD-10-CM | POA: Diagnosis not present

## 2023-06-10 ENCOUNTER — Ambulatory Visit: Admitting: Gastroenterology

## 2023-06-10 ENCOUNTER — Encounter: Payer: Self-pay | Admitting: *Deleted

## 2023-06-10 ENCOUNTER — Other Ambulatory Visit: Payer: Self-pay | Admitting: *Deleted

## 2023-06-10 ENCOUNTER — Encounter: Payer: Self-pay | Admitting: Gastroenterology

## 2023-06-10 ENCOUNTER — Telehealth: Payer: Self-pay | Admitting: *Deleted

## 2023-06-10 VITALS — BP 106/70 | HR 81 | Temp 97.9°F | Ht 68.0 in | Wt 210.2 lb

## 2023-06-10 DIAGNOSIS — Z1211 Encounter for screening for malignant neoplasm of colon: Secondary | ICD-10-CM | POA: Insufficient documentation

## 2023-06-10 MED ORDER — PEG 3350-KCL-NA BICARB-NACL 420 G PO SOLR: 4000.0000 mL | Freq: Once | ORAL | 0 refills | Status: AC

## 2023-06-10 NOTE — Patient Instructions (Signed)
Colonoscopy to be scheduled. 

## 2023-06-10 NOTE — Progress Notes (Signed)
 GI Office Note    Referring Provider: Kathyleen Parkins, MD Primary Care Physician:  Kathyleen Parkins, MD  Primary Gastroenterologist: Rolando Cliche. Mordechai April, DO   Chief Complaint   Chief Complaint  Patient presents with   Colonoscopy     History of Present Illness   Christopher Obrien is a 73 y.o. male presenting today to schcedule screening colonoscopy. Due to his history of cardiac disease, he presents for evaluation prior to scheduling.  He has a history of non-STEMI in 2005 status post bare-metal stent.  History of aortic valve sclerosis without stenosis on echo in 2015, seen by Dr.Croitoru 11/2022, with faint 1 out of 6 to 2 out of 6 murmur, electing to hold off on updated echocardiogram until after his yearly follow-up.  Walks daily.  He does resistance training in the gym at least 3 days/week.  Has dropped 35 pounds intentionally.  Feels well.  Currently on doxycycline  for tick bite.  No issues from a GI standpoint.  Bowel movements are regular.  No blood in the stool or melena.  No abdominal pain.  Recently was diagnosed with a right inguinal hernia by his PCP.  No upper GI symptoms.   Colonoscopy 12/2012: -moderate diverticulsois -left colon reduntant -large internal hemorrhoirds    Medications   Current Outpatient Medications  Medication Sig Dispense Refill   aspirin 81 MG tablet Take 81 mg by mouth at bedtime.      Cholecalciferol (D3 ADULT PO) Take 1,000 Int'l Units/kg by mouth daily.     doxycycline  (VIBRAMYCIN ) 100 MG capsule Take 100 mg by mouth 2 (two) times daily.     lisinopril  (ZESTRIL ) 5 MG tablet Take 1 tablet (5 mg total) by mouth daily. 90 tablet 3   Multiple Vitamins-Minerals (ONE DAILY 50 PLUS PO) Take 1 tablet by mouth daily at 6 (six) AM.     Probiotic Product (PROBIOTIC DAILY PO) Take by mouth.     simvastatin  (ZOCOR ) 20 MG tablet Take 1 tablet (20 mg total) by mouth at bedtime. 90 tablet 3   No current facility-administered medications for this visit.     Allergies   Allergies as of 06/10/2023 - Review Complete 06/10/2023  Allergen Reaction Noted   Bee venom Hives, Itching, and Swelling 08/09/2014   Penicillins Rash 11/02/2012    Past Medical History   Past Medical History:  Diagnosis Date   CAD (coronary artery disease)    Hyperlipidemia    NSTEMI (non-ST elevated myocardial infarction) (HCC) 2005   Systemic hypertension     Past Surgical History   Past Surgical History:  Procedure Laterality Date   APPENDECTOMY  1995   CHOLECYSTECTOMY     COLONOSCOPY N/A 01/12/2013   Procedure: COLONOSCOPY;  Surgeon: Alyce Jubilee, MD;  Location: [facility];  Service: Endoscopy;  Laterality: N/A;  10:30    CORONARY ANGIOPLASTY WITH STENT PLACEMENT  07/09/2003   bare metal stent to the first oblique marginal artery   NM MYOCAR PERF WALL MOTION  03/15/2008   normal    Past Family History   Family History  Adopted: Yes    Past Social History   Social History   Socioeconomic History   Marital status: Married    Spouse name: Not on file   Number of children: Not on file   Years of education: Not on file   Highest education level: Not on file  Occupational History   Not on file  Tobacco Use   Smoking status: Never  Smokeless tobacco: Never  Vaping Use   Vaping status: Never Used  Substance and Sexual Activity   Alcohol use: No   Drug use: No   Sexual activity: Not on file  Other Topics Concern   Not on file  Social History Narrative   Not on file   Social Drivers of Health   Financial Resource Strain: Not on file  Food Insecurity: Not on file  Transportation Needs: Not on file  Physical Activity: Not on file  Stress: Not on file  Social Connections: Not on file  Intimate Partner Violence: Not on file    Review of Systems   General: Negative for anorexia, weight loss, fever, chills, fatigue, weakness. Eyes: Negative for vision changes.  ENT: Negative for hoarseness, difficulty swallowing , nasal  congestion. CV: Negative for chest pain, angina, palpitations, dyspnea on exertion, peripheral edema.  Respiratory: Negative for dyspnea at rest, dyspnea on exertion, cough, sputum, wheezing.  GI: See history of present illness. GU:  Negative for dysuria, hematuria, urinary incontinence, urinary frequency, nocturnal urination.  See HPI MS: Negative for joint pain, low back pain.  Derm: Negative for rash or itching.  Neuro: Negative for weakness, abnormal sensation, seizure, frequent headaches, memory loss,  confusion.  Psych: Negative for anxiety, depression, suicidal ideation, hallucinations.  Endo: Negative for unusual weight change.  Heme: Negative for bruising or bleeding. Allergy: Negative for rash or hives.  Physical Exam   BP 106/70 (BP Location: Right Arm, Patient Position: Sitting, Cuff Size: Large)   Pulse 81   Temp 97.9 F (36.6 C) (Oral)   Ht 5\' 8"  (1.727 m)   Wt 210 lb 3.2 oz (95.3 kg)   SpO2 97%   BMI 31.96 kg/m    General: Well-nourished, well-developed in no acute distress.  Head: Normocephalic, atraumatic.   Eyes: Conjunctiva pink, no icterus. Mouth: Oropharyngeal mucosa moist and pink   Neck: Supple without thyromegaly, masses, or lymphadenopathy.  Lungs: Clear to auscultation bilaterally.  Heart:1/6 murmur, Regular rate and rhythm, no rubs or gallops.  Abdomen: Bowel sounds are normal, nontender, nondistended, no hepatosplenomegaly or masses,  no abdominal bruits or hernia, no rebound or guarding.   Rectal: not performed Extremities: No lower extremity edema. No clubbing or deformities.  Neuro: Alert and oriented x 4 , grossly normal neurologically.  Skin: Warm and dry, no rash or jaundice.   Psych: Alert and cooperative, normal mood and affect.  Labs   March 2025: Blood cell count 8000, hemoglobin 15.3, platelets 277,000, glucose 92, BUN 12, creatinine 0.95, potassium 5.5, sodium 142, albumin 4.5, total bilirubin 0.7, alk phos 82, AST 21, ALT 19, B12  468, folate greater than 20, TSH 0.927 Imaging Studies   No results found.  Assessment/Plan   Colon cancer screening:  -colonoscopy with Dr. Mordechai April. ASA 3. Rm 1,2.  I have discussed the risks, alternatives, benefits with regards to but not limited to the risk of reaction to medication, bleeding, infection, perforation and the patient is agreeable to proceed. Written consent to be obtained.    Trudie Fuse. Harles Lied, MHS, PA-C [facility]

## 2023-06-10 NOTE — Telephone Encounter (Signed)
 Pt informed that pre-op is scheduled for Friday 07/12/23 at 10 am. Verbalized understanding.

## 2023-06-14 DIAGNOSIS — Z6831 Body mass index (BMI) 31.0-31.9, adult: Secondary | ICD-10-CM | POA: Diagnosis not present

## 2023-06-14 DIAGNOSIS — K4090 Unilateral inguinal hernia, without obstruction or gangrene, not specified as recurrent: Secondary | ICD-10-CM | POA: Diagnosis not present

## 2023-06-14 DIAGNOSIS — E6609 Other obesity due to excess calories: Secondary | ICD-10-CM | POA: Diagnosis not present

## 2023-07-11 ENCOUNTER — Other Ambulatory Visit (HOSPITAL_COMMUNITY)

## 2023-07-12 ENCOUNTER — Encounter (HOSPITAL_COMMUNITY): Payer: Self-pay

## 2023-07-12 ENCOUNTER — Encounter (HOSPITAL_COMMUNITY)
Admission: RE | Admit: 2023-07-12 | Discharge: 2023-07-12 | Disposition: A | Discharge status: Home / Self Care | Source: Ambulatory Visit | Attending: Internal Medicine | Admitting: Internal Medicine

## 2023-07-12 ENCOUNTER — Other Ambulatory Visit: Payer: Self-pay

## 2023-07-12 HISTORY — DX: Other specified health status: Z78.9

## 2023-07-15 ENCOUNTER — Other Ambulatory Visit: Payer: Self-pay

## 2023-07-15 ENCOUNTER — Ambulatory Visit (HOSPITAL_COMMUNITY)
Admission: RE | Admit: 2023-07-15 | Discharge: 2023-07-15 | Disposition: A | Discharge status: Home / Self Care | Attending: Internal Medicine | Admitting: Internal Medicine

## 2023-07-15 ENCOUNTER — Ambulatory Visit (HOSPITAL_COMMUNITY): Admitting: Anesthesiology

## 2023-07-15 ENCOUNTER — Encounter (HOSPITAL_COMMUNITY): Payer: Self-pay | Admitting: Internal Medicine

## 2023-07-15 ENCOUNTER — Encounter (HOSPITAL_COMMUNITY)
Admission: RE | Disposition: A | Discharge status: Home / Self Care | Payer: Self-pay | Source: Home / Self Care | Attending: Internal Medicine

## 2023-07-15 DIAGNOSIS — I252 Old myocardial infarction: Secondary | ICD-10-CM | POA: Diagnosis not present

## 2023-07-15 DIAGNOSIS — I10 Essential (primary) hypertension: Secondary | ICD-10-CM | POA: Insufficient documentation

## 2023-07-15 DIAGNOSIS — Z1211 Encounter for screening for malignant neoplasm of colon: Secondary | ICD-10-CM

## 2023-07-15 DIAGNOSIS — K5730 Diverticulosis of large intestine without perforation or abscess without bleeding: Secondary | ICD-10-CM | POA: Diagnosis not present

## 2023-07-15 DIAGNOSIS — K648 Other hemorrhoids: Secondary | ICD-10-CM | POA: Diagnosis not present

## 2023-07-15 DIAGNOSIS — I2510 Atherosclerotic heart disease of native coronary artery without angina pectoris: Secondary | ICD-10-CM | POA: Diagnosis not present

## 2023-07-15 HISTORY — PX: COLONOSCOPY: SHX5424

## 2023-07-15 SURGERY — COLONOSCOPY
Anesthesia: General

## 2023-07-15 MED ORDER — PROPOFOL 500 MG/50ML IV EMUL: INTRAVENOUS | Status: DC | PRN

## 2023-07-15 MED ORDER — LACTATED RINGERS IV SOLN: INTRAVENOUS | Status: DC

## 2023-07-15 NOTE — Discharge Instructions (Addendum)
  Colonoscopy Discharge Instructions  Read the instructions outlined below and refer to this sheet in the next few weeks. These discharge instructions provide you with general information on caring for yourself after you leave the hospital. Your doctor may also give you specific instructions. While your treatment has been planned according to the most current medical practices available, unavoidable complications occasionally occur.   ACTIVITY You may resume your regular activity, but move at a slower pace for the next 24 hours.  Take frequent rest periods for the next 24 hours.  Walking will help get rid of the air and reduce the bloated feeling in your belly (abdomen).  No driving for 24 hours (because of the medicine (anesthesia) used during the test).   Do not sign any important legal documents or operate any machinery for 24 hours (because of the anesthesia used during the test).  NUTRITION Drink plenty of fluids.  You may resume your normal diet as instructed by your doctor.  Begin with a light meal and progress to your normal diet. Heavy or fried foods are harder to digest and may make you feel sick to your stomach (nauseated).  Avoid alcoholic beverages for 24 hours or as instructed.  MEDICATIONS You may resume your normal medications unless your doctor tells you otherwise.  WHAT YOU CAN EXPECT TODAY Some feelings of bloating in the abdomen.  Passage of more gas than usual.  Spotting of blood in your stool or on the toilet paper.  IF YOU HAD POLYPS REMOVED DURING THE COLONOSCOPY: No aspirin products for 7 days or as instructed.  No alcohol for 7 days or as instructed.  Eat a soft diet for the next 24 hours.  FINDING OUT THE RESULTS OF YOUR TEST Not all test results are available during your visit. If your test results are not back during the visit, make an appointment with your caregiver to find out the results. Do not assume everything is normal if you have not heard from your  caregiver or the medical facility. It is important for you to follow up on all of your test results.  SEEK IMMEDIATE MEDICAL ATTENTION IF: You have more than a spotting of blood in your stool.  Your belly is swollen (abdominal distention).  You are nauseated or vomiting.  You have a temperature over 101.  You have abdominal pain or discomfort that is severe or gets worse throughout the day.   Your colonoscopy was relatively unremarkable.  I did not find any polyps or evidence of colon cancer.  Given your age, I do not think you need further colonoscopies for colon cancer screening.  You do have diverticulosis and internal hemorrhoids. I would recommend increasing fiber in your diet or adding OTC Benefiber/Metamucil. Be sure to drink at least 4 to 6 glasses of water  daily. Follow-up with GI as needed.   I hope you have a great rest of your week!  Rolando Cliche. Mordechai April, D.O. Gastroenterology and Hepatology [facility]

## 2023-07-15 NOTE — Transfer of Care (Signed)
 Immediate Anesthesia Transfer of Care Note  Patient: Christopher Obrien  Procedure(s) Performed: COLONOSCOPY  Patient Location: Short Stay  Anesthesia Type:General  Level of Consciousness: drowsy  Airway & Oxygen Therapy: Patient Spontanous Breathing  Post-op Assessment: Report given to RN and Post -op Vital signs reviewed and stable  Post vital signs: Reviewed and stable  Last Vitals:  Vitals Value Taken Time  BP 88/60 07/15/2023  0845  Temp 96.9 07/15/2023  0845  Pulse 65 07/11/2023   0845  Resp 12 07/11/2023  0845  SpO2 100 07/11/2023  0845    Last Pain:  Vitals:   07/15/23 0812  TempSrc:   PainSc: 0-No pain         Complications: No notable events documented.

## 2023-07-15 NOTE — Op Note (Signed)
 [facility] Patient Name: Christopher Obrien Procedure Date: 07/15/2023 7:59 AM MRN: [id number] Date of Birth: 05-May-1950 Attending MD: Rolando Cliche. Mordechai April , [place], [id number] CSN: [id number] Age: 73 Admit Type: Outpatient Procedure:                Colonoscopy Indications:              Screening for colorectal malignant neoplasm Providers:                Rolando Cliche. Mordechai April, DO, Crystal Page, Annell Barrow Referring MD:              Medicines:                See the Anesthesia note for documentation of the                            administered medications Complications:            No immediate complications. Estimated Blood Loss:     Estimated blood loss: none. Procedure:                Pre-Anesthesia Assessment:                           - The anesthesia plan was to use monitored                            anesthesia care (MAC).                           After obtaining informed consent, the colonoscope                            was passed under direct vision. Throughout the                            procedure, the patient's blood pressure, pulse, and                            oxygen saturations were monitored continuously. The                            [id number]-HQ190L ([id number]) scope was introduced through                            the anus and advanced to the the cecum, identified                            by appendiceal orifice and ileocecal valve. The                            colonoscopy was performed without difficulty. The                            patient tolerated the procedure well. The quality  of the bowel preparation was evaluated using the                            BBPS [facility] Bowel Preparation Scale) with scores                            of: Right Colon = 3, Transverse Colon = 3 and Left                            Colon = 3 (entire mucosa seen well with no residual                            staining, small  fragments of stool or opaque                            liquid). The total BBPS score equals 9. Scope In: 8:16:09 AM Scope Out: 8:38:06 AM Scope Withdrawal Time: 0 hours 17 minutes 35 seconds  Total Procedure Duration: 0 hours 21 minutes 57 seconds  Findings:      Non-bleeding internal hemorrhoids were found.      Multiple large-mouthed and small-mouthed diverticula were found in the       sigmoid colon, transverse colon and ascending colon.      The exam was otherwise without abnormality. Impression:               - Non-bleeding internal hemorrhoids.                           - Diverticulosis in the sigmoid colon, in the                            transverse colon and in the ascending colon.                           - The examination was otherwise normal.                           - No specimens collected. Moderate Sedation:      Per Anesthesia Care Recommendation:           - Patient has a contact number available for                            emergencies. The signs and symptoms of potential                            delayed complications were discussed with the                            patient. Return to normal activities tomorrow.                            Written discharge instructions were provided to the  patient.                           - Continue present medications.                           - Resume previous diet.                           - No repeat colonoscopy due to age.                           - Return to GI clinic PRN. Procedure Code(s):        --- Professional ---                           W0981, Colorectal cancer screening; colonoscopy on                            individual not meeting criteria for high risk Diagnosis Code(s):        --- Professional ---                           Z12.11, Encounter for screening for malignant                            neoplasm of colon                           K64.8, Other hemorrhoids                            K57.30, Diverticulosis of large intestine without                            perforation or abscess without bleeding CPT copyright 2022 American Medical Association. All rights reserved. The codes documented in this report are preliminary and upon coder review may  be revised to meet current compliance requirements. Rolando Cliche. Mordechai April, DO Rolando Cliche. Mordechai April, DO 07/15/2023 8:42:02 AM This report has been signed electronically. Number of Addenda: 0

## 2023-07-15 NOTE — Anesthesia Preprocedure Evaluation (Signed)
 Anesthesia Evaluation  Patient identified by MRN, date of birth, ID band Patient awake    Reviewed: Allergy & Precautions, H&P , NPO status , Patient's Chart, lab work & pertinent test results, reviewed documented beta blocker date and time   Airway Mallampati: II  TM Distance: >3 FB Neck ROM: full    Dental no notable dental hx.    Pulmonary neg pulmonary ROS   Pulmonary exam normal breath sounds clear to auscultation       Cardiovascular Exercise Tolerance: Good hypertension, + CAD and + Past MI   Rhythm:regular Rate:Normal     Neuro/Psych negative neurological ROS  negative psych ROS   GI/Hepatic negative GI ROS, Neg liver ROS,,,  Endo/Other  negative endocrine ROS    Renal/GU negative Renal ROS  negative genitourinary   Musculoskeletal   Abdominal   Peds  Hematology negative hematology ROS (+)   Anesthesia Other Findings   Reproductive/Obstetrics negative OB ROS                             Anesthesia Physical Anesthesia Plan  ASA: 3  Anesthesia Plan: General   Post-op Pain Management:    Induction:   PONV Risk Score and Plan: Propofol infusion  Airway Management Planned:   Additional Equipment:   Intra-op Plan:   Post-operative Plan:   Informed Consent: I have reviewed the patients History and Physical, chart, labs and discussed the procedure including the risks, benefits and alternatives for the proposed anesthesia with the patient or authorized representative who has indicated his/her understanding and acceptance.     Dental Advisory Given  Plan Discussed with: CRNA  Anesthesia Plan Comments:        Anesthesia Quick Evaluation

## 2023-07-15 NOTE — Anesthesia Procedure Notes (Signed)
 Date/Time: 07/15/2023 8:11 AM  Performed by: Verline Glow, CRNAPre-anesthesia Checklist: Patient identified, Emergency Drugs available, Suction available, Timeout performed and Patient being monitored Patient Re-evaluated:Patient Re-evaluated prior to induction Oxygen Delivery Method: Nasal Cannula

## 2023-07-15 NOTE — H&P (Signed)
 Primary Care Physician:  Kathyleen Parkins, MD Primary Gastroenterologist:  Dr. Mordechai April  Pre-Procedure History & Physical: HPI:  Christopher Obrien is a 73 y.o. male is here for a colonoscopy for colon cancer screening purposes.  Patient denies any family history of colorectal cancer.   Past Medical History:  Diagnosis Date   Adopted    does not know blood familial history   CAD (coronary artery disease)    Hyperlipidemia    NSTEMI (non-ST elevated myocardial infarction) (HCC) 2005   Systemic hypertension     Past Surgical History:  Procedure Laterality Date   APPENDECTOMY  1995   CHOLECYSTECTOMY     COLONOSCOPY N/A 01/12/2013   Procedure: COLONOSCOPY;  Surgeon: Alyce Jubilee, MD;  Location: [facility];  Service: Endoscopy;  Laterality: N/A;  10:30    CORONARY ANGIOPLASTY WITH STENT PLACEMENT  07/09/2003   bare metal stent to the first oblique marginal artery   NM MYOCAR PERF WALL MOTION  03/15/2008   normal    Prior to Admission medications   Medication Sig Start Date End Date Taking? Authorizing Provider  aspirin 81 MG tablet Take 81 mg by mouth at bedtime.     [provider]  Cholecalciferol (D3 ADULT PO) Take 1,000 Int'l Units/kg by mouth daily.    [provider]  doxycycline  (VIBRAMYCIN ) 100 MG capsule Take 100 mg by mouth 2 (two) times daily. 06/04/23   [provider]  lisinopril  (ZESTRIL ) 5 MG tablet Take 1 tablet (5 mg total) by mouth daily. 12/17/22   Croitoru, Mihai, MD  Multiple Vitamins-Minerals (ONE DAILY 50 PLUS PO) Take 1 tablet by mouth daily at 6 (six) AM.    [provider]  Omega-3 Fatty Acids (FISH OIL) 1200 MG CAPS Take by mouth.    [provider]  Probiotic Product (PROBIOTIC DAILY PO) Take by mouth.    [provider]  simvastatin  (ZOCOR ) 20 MG tablet Take 1 tablet (20 mg total) by mouth at bedtime. 12/26/22   Croitoru, Karyl Paget, MD    Allergies as of 06/10/2023 - Review Complete 06/10/2023  Allergen  Reaction Noted   Bee venom Hives, Itching, and Swelling 08/09/2014   Penicillins Rash 11/02/2012    Family History  Adopted: Yes    Social History   Socioeconomic History   Marital status: Married    Spouse name: Not on file   Number of children: Not on file   Years of education: Not on file   Highest education level: Not on file  Occupational History   Not on file  Tobacco Use   Smoking status: Never   Smokeless tobacco: Never  Vaping Use   Vaping status: Never Used  Substance and Sexual Activity   Alcohol use: No   Drug use: No   Sexual activity: Yes  Other Topics Concern   Not on file  Social History Narrative   Not on file   Social Drivers of Health   Financial Resource Strain: Not on file  Food Insecurity: Not on file  Transportation Needs: Not on file  Physical Activity: Not on file  Stress: Not on file  Social Connections: Not on file  Intimate Partner Violence: Not on file    Review of Systems: See HPI, otherwise negative ROS  Physical Exam: Vital signs in last 24 hours: Weight:  [95.3 kg] 95.3 kg (06/16 0723)   General:   Alert,  Well-developed, well-nourished, pleasant and cooperative in NAD Head:  Normocephalic and atraumatic. Eyes:  Sclera  clear, no icterus.   Conjunctiva pink. Ears:  Normal auditory acuity. Nose:  No deformity, discharge,  or lesions. Msk:  Symmetrical without gross deformities. Normal posture. Extremities:  Without clubbing or edema. Neurologic:  Alert and  oriented x4;  grossly normal neurologically. Skin:  Intact without significant lesions or rashes. Psych:  Alert and cooperative. Normal mood and affect.  Impression/Plan: Christopher Obrien is here for a colonoscopy to be performed for colon cancer screening purposes.  The risks of the procedure including infection, bleed, or perforation as well as benefits, limitations, alternatives and imponderables have been reviewed with the patient. Questions have been answered. All  parties agreeable.

## 2023-07-16 ENCOUNTER — Encounter (HOSPITAL_COMMUNITY): Payer: Self-pay | Admitting: Internal Medicine

## 2023-07-16 NOTE — Anesthesia Postprocedure Evaluation (Signed)
 Anesthesia Post Note  Patient: Christopher Obrien  Procedure(s) Performed: COLONOSCOPY  Patient location during evaluation: Phase II Anesthesia Type: General Level of consciousness: awake Pain management: pain level controlled Vital Signs Assessment: post-procedure vital signs reviewed and stable Respiratory status: spontaneous breathing and respiratory function stable Cardiovascular status: blood pressure returned to baseline and stable Postop Assessment: no headache and no apparent nausea or vomiting Anesthetic complications: no Comments: Late entry   No notable events documented.   Last Vitals:  Vitals:   07/15/23 0840 07/15/23 0844  BP: (!) 88/60 96/60  Pulse: 65   Resp: 13   Temp: (!) 36.1 C (!) 36.4 C  SpO2: 100%     Last Pain:  Vitals:   07/15/23 0844  TempSrc: Oral  PainSc:                  Coretha Dew

## 2023-07-19 ENCOUNTER — Other Ambulatory Visit: Payer: Self-pay | Admitting: *Deleted

## 2023-07-19 DIAGNOSIS — K4090 Unilateral inguinal hernia, without obstruction or gangrene, not specified as recurrent: Secondary | ICD-10-CM

## 2023-07-23 ENCOUNTER — Ambulatory Visit: Admitting: General Surgery

## 2023-07-23 ENCOUNTER — Encounter: Payer: Self-pay | Admitting: General Surgery

## 2023-07-23 VITALS — BP 116/69 | HR 74 | Temp 98.2°F | Resp 14 | Ht 68.0 in | Wt 211.0 lb

## 2023-07-23 DIAGNOSIS — K4090 Unilateral inguinal hernia, without obstruction or gangrene, not specified as recurrent: Secondary | ICD-10-CM | POA: Diagnosis not present

## 2023-07-24 NOTE — Progress Notes (Signed)
 Christopher Obrien; [id number]; 1950/12/09   HPI Patient is a 73 year old white male who was referred to my care by Ubaldo Carnes for evaluation and treatment of a right inguinal hernia.  Patient states he was lifting something heavy and felt something pop in February of this year.  Since that time, he has noticed a lump in the right groin region when straining.  He does use a truss when he is standing or doing more strenuous activity.  He denies any episodes of incarceration. Past Medical History:  Diagnosis Date   Adopted    does not know blood familial history   CAD (coronary artery disease)    Hyperlipidemia    NSTEMI (non-ST elevated myocardial infarction) (HCC) 2005   Systemic hypertension     Past Surgical History:  Procedure Laterality Date   APPENDECTOMY  1995   CHOLECYSTECTOMY     COLONOSCOPY N/A 01/12/2013   Procedure: COLONOSCOPY;  Surgeon: Margo LITTIE Haddock, MD;  [facility];  Service: Endoscopy;  Laterality: N/A;  10:30    COLONOSCOPY N/A 07/15/2023   Procedure: COLONOSCOPY;  Surgeon: Cindie Carlin POUR, DO;  [facility]: [facility];  Service: Endoscopy;  Laterality: N/A;  8:30 am, asa 3   CORONARY ANGIOPLASTY WITH STENT PLACEMENT  07/09/2003   bare metal stent to the first oblique marginal artery   NM MYOCAR PERF WALL MOTION  03/15/2008   normal    Family History  Adopted: Yes    Current Outpatient Medications on File Prior to Visit  Medication Sig Dispense Refill   aspirin 81 MG tablet Take 81 mg by mouth at bedtime.      Cholecalciferol (D3 ADULT PO) Take 1,000 Int'l Units/kg by mouth daily.     lisinopril  (ZESTRIL ) 5 MG tablet Take 1 tablet (5 mg total) by mouth daily. 90 tablet 3   Multiple Vitamins-Minerals (ONE DAILY 50 PLUS PO) Take 1 tablet by mouth daily at 6 (six) AM.     Omega-3 Fatty Acids (FISH OIL) 1200 MG CAPS Take by mouth.     Probiotic Product (PROBIOTIC DAILY PO) Take by mouth.     simvastatin  (ZOCOR ) 20 MG tablet Take 1 tablet (20 mg total) by  mouth at bedtime. 90 tablet 3   doxycycline  (VIBRAMYCIN ) 100 MG capsule Take 100 mg by mouth 2 (two) times daily.     No current facility-administered medications on file prior to visit.    Allergies  Allergen Reactions   Bee Venom Hives, Itching and Swelling    Swelling to lips, eyes, ears, and possibly throat   Penicillins Rash    Has patient had a PCN reaction causing immediate rash, facial/tongue/throat swelling, SOB or lightheadedness with hypotension: No Has patient had a PCN reaction causing severe rash involving mucus membranes or skin necrosis: No Has patient had a PCN reaction that required hospitalization: No Has patient had a PCN reaction occurring within the last 10 years: No If all of the above answers are NO, then may proceed with Cephalosporin use.     Social History   Substance and Sexual Activity  Alcohol Use No    Social History   Tobacco Use  Smoking Status Never  Smokeless Tobacco Never    Review of Systems  Constitutional: Negative.   HENT: Negative.    Eyes: Negative.   Respiratory: Negative.    Cardiovascular: Negative.   Gastrointestinal: Negative.   Genitourinary: Negative.   Musculoskeletal: Negative.   Skin: Negative.   Neurological: Negative.   Endo/Heme/Allergies:  Negative.   Psychiatric/Behavioral: Negative.      Objective   Vitals:   07/23/23 1258  BP: 116/69  Pulse: 74  Resp: 14  Temp: 98.2 F (36.8 C)    Physical Exam Vitals reviewed.  Constitutional:      Appearance: Normal appearance. He is not ill-appearing.  HENT:     Head: Normocephalic and atraumatic.   Cardiovascular:     Rate and Rhythm: Normal rate and regular rhythm.     Heart sounds: Normal heart sounds. No murmur heard.    No friction rub. No gallop.  Pulmonary:     Effort: Pulmonary effort is normal. No respiratory distress.     Breath sounds: Normal breath sounds. No stridor. No wheezing, rhonchi or rales.  Abdominal:     General: There is no  distension.     Palpations: Abdomen is soft. There is no mass.     Tenderness: There is no abdominal tenderness. There is no guarding or rebound.     Hernia: A hernia is present.     Comments: Easily reducible right inguinal hernia  Genitourinary:    Testes: Normal.   Skin:    General: Skin is warm and dry.   Neurological:     Mental Status: He is alert and oriented to person, place, and time.     Assessment  Right inguinal hernia, symptomatic Plan  Patient will call to schedule his robotic assisted laparoscopic right inguinal herniorrhaphy with mesh.  The risks and benefits of the procedure including bleeding, infection, mesh use, and the possibility of recurrence of the hernia were fully explained to the patient, who gave informed consent.

## 2023-08-08 ENCOUNTER — Telehealth: Payer: Self-pay | Admitting: Internal Medicine

## 2023-08-08 NOTE — Telephone Encounter (Unsigned)
 Copied from CRM [id number]. Topic: Appointments - Appointment Scheduling >> Aug 08, 2023  9:28 AM Christopher Obrien wrote: Patient/patient representative is calling to schedule an appointment. Refer to attachments for appointment information.  Patient is calling to schedule an appointment with Dr.Hunter as a new patient he is a relative for Rankin and Eleanor Daring who are patients with Dr.Hunter and were approved to be seen.

## 2023-08-08 NOTE — Telephone Encounter (Signed)
 Yes thanks-willing to see

## 2023-10-03 ENCOUNTER — Encounter: Payer: Self-pay | Admitting: Family Medicine

## 2023-10-03 ENCOUNTER — Ambulatory Visit: Admitting: Family Medicine

## 2023-10-03 VITALS — BP 122/68 | HR 78 | Temp 98.4°F | Ht 68.0 in | Wt 213.4 lb

## 2023-10-03 DIAGNOSIS — T7840XD Allergy, unspecified, subsequent encounter: Secondary | ICD-10-CM | POA: Diagnosis not present

## 2023-10-03 DIAGNOSIS — E559 Vitamin D deficiency, unspecified: Secondary | ICD-10-CM

## 2023-10-03 DIAGNOSIS — H9190 Unspecified hearing loss, unspecified ear: Secondary | ICD-10-CM | POA: Insufficient documentation

## 2023-10-03 DIAGNOSIS — I2510 Atherosclerotic heart disease of native coronary artery without angina pectoris: Secondary | ICD-10-CM

## 2023-10-03 DIAGNOSIS — E785 Hyperlipidemia, unspecified: Secondary | ICD-10-CM

## 2023-10-03 DIAGNOSIS — Z131 Encounter for screening for diabetes mellitus: Secondary | ICD-10-CM | POA: Diagnosis not present

## 2023-10-03 DIAGNOSIS — E669 Obesity, unspecified: Secondary | ICD-10-CM

## 2023-10-03 DIAGNOSIS — J3089 Other allergic rhinitis: Secondary | ICD-10-CM

## 2023-10-03 DIAGNOSIS — I10 Essential (primary) hypertension: Secondary | ICD-10-CM | POA: Diagnosis not present

## 2023-10-03 DIAGNOSIS — R351 Nocturia: Secondary | ICD-10-CM

## 2023-10-03 NOTE — Patient Instructions (Addendum)
 Health Maintenance Due  Topic Date Due   DTaP/Tdap/Td (1 - Tdap) Never done  We will look for Tetanus, Diphtheria, and Pertussis (Tdap) when we get records- if you get a bad cut/scrape you may want to call to confirm with us  or them vaccine in last year  Get labs done at [facility] and check in with me maybe 2 weeks after to make sure I've seen them  Recommended follow up: Return in about 1 year (around 10/02/2024) for physical or sooner if needed.Schedule b4 you leave.

## 2023-10-03 NOTE — Progress Notes (Signed)
 Phone: [phone]   Subjective:  Patient presents today to establish care.  Prior patient of Dr. Bertell who is retiring- office will close Sept 26.   Chief Complaint  Patient presents with   Establish Care   See problem oriented charting  The following were reviewed and entered/updated in epic: Past Medical History:  Diagnosis Date   Adopted    does not know blood familial history   Allergy    CAD (coronary artery disease)    Heart murmur 2022   Hyperlipidemia    NSTEMI (non-ST elevated myocardial infarction) (HCC) 2005   Systemic hypertension    Patient Active Problem List   Diagnosis Date Noted   CAD - NSTEMI 2005, bare metal stent OM1 11/05/2012    Priority: High   Hearing loss 10/03/2023    Priority: Medium    Hyperlipidemia 11/05/2012    Priority: Medium    HTN (hypertension) 11/05/2012    Priority: Medium    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/03/2017    Priority: Low   Perennial and seasonal allergic rhinitis 09/03/2017    Priority: Low   Obesity (BMI 30.0-34.9) 11/05/2012    Priority: Low   Vitamin D deficiency 10/03/2023   Past Surgical History:  Procedure Laterality Date   APPENDECTOMY  1995   CHOLECYSTECTOMY     COLONOSCOPY N/A 01/12/2013   Procedure: COLONOSCOPY;  Surgeon: Margo LITTIE Haddock, MD;  [facility];  Service: Endoscopy;  Laterality: N/A;  10:30    COLONOSCOPY N/A 07/15/2023   Procedure: COLONOSCOPY;  Surgeon: Cindie Carlin POUR, DO;  [facility];  Service: Endoscopy;  Laterality: N/A;  8:30 am, asa 3   CORONARY ANGIOPLASTY WITH STENT PLACEMENT  07/09/2003   bare metal stent to the first oblique marginal artery   NM MYOCAR PERF WALL MOTION  03/15/2008   normal    Family History  Adopted: Yes    Medications- reviewed and updated Current Outpatient Medications  Medication Sig Dispense Refill   aspirin 81 MG tablet Take 81 mg by mouth at bedtime.      Cholecalciferol (D3 ADULT PO) Take 1,000 Int'l Units/kg  by mouth daily.     lisinopril  (ZESTRIL ) 5 MG tablet Take 1 tablet (5 mg total) by mouth daily. 90 tablet 3   Multiple Vitamins-Minerals (ONE DAILY 50 PLUS PO) Take 1 tablet by mouth daily at 6 (six) AM.     Omega-3 Fatty Acids (FISH OIL) 1200 MG CAPS Take by mouth.     Probiotic Product (PROBIOTIC DAILY PO) Take by mouth.     simvastatin  (ZOCOR ) 20 MG tablet Take 1 tablet (20 mg total) by mouth at bedtime. 90 tablet 3   No current facility-administered medications for this visit.    Allergies-reviewed and updated Allergies  Allergen Reactions   Bee Venom Hives, Itching and Swelling    Swelling to lips, eyes, ears, and possibly throat   Penicillins Rash    Has patient had a PCN reaction causing immediate rash, facial/tongue/throat swelling, SOB or lightheadedness with hypotension: No Has patient had a PCN reaction causing severe rash involving mucus membranes or skin necrosis: No Has patient had a PCN reaction that required hospitalization: No Has patient had a PCN reaction occurring within the last 10 years: No If all of the above answers are NO, then may proceed with Cephalosporin use.     Social History   Social History Narrative   Married. 3 kids. 7 grandkids- 1 boy and 6 girls.  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 Oropharynx normal. TM normal. Eyes: sclera and lids normal, PERRLA Neck: no thyromegaly, no cervical lymphadenopathy CV: RRR faint systolic murmur right upper sternal border Lungs: CTAB no crackles, wheeze, rhonchi Abdomen:  soft/nontender/nondistended/normal bowel sounds. No rebound or guarding.  Ext: no edema Skin: warm, dry Neuro: 5/5 strength in upper and lower extremities, normal gait, normal reflexes    Assessment and Plan:   # Labs-plans to get labs done at [facility]-we printed a copy for him of requisition-we wanted him to have these available for cardiology visit in November  # Right inguinal hernia S:planning surgery in [place] with Dr. Mavis outpatient but looking like November to complete this.   A/P: he would love to do this sooner but a lot of items that he is being called to by family pushing this out some. He is aware of emergent precautions though.    #obesity- up to 236 and as low as 200 through 2025.  Currently back up to 213 but plans to double down efforts to get this back down  #CAD-Dr. Croitoru sees with history NSTEMI with stent to oblique marginal artery in 2005 #hyperlipidemia S: Medication: simvastatin  20 mg, fish oil , aspirin 81 mg daily -3-4 x a week at the gym walking and weighted machines -murmur due to aortic sclerosis - mild regurgitation as well -No chest pain or shortness of breath  A/P: CAD asymptomatic-continue current medications and update lipid panel with LDL goal under 70  #hypertension S: medication: lisinopril  5 mg daily. Blood pressure fluctuates with weight BP Readings from Last 3 Encounters:  10/03/23 122/68  07/23/23 116/69  07/15/23 96/60    A/P: Blood pressure well-controlled-continue current medication-I am happy to refill if needed  #Health maintenance- sees Dr. Shona dermatology  - was told after colonoscopy age 39 no repeat needed  -Tetanus, Diphtheria, and Pertussis (Tdap) tihnks had last visit [facility]-he has had records sent here so we are hoping we will get a copy of these  #Vitamin D deficiency S: Medication: 1000 units plus multivitamin with 600 units -was advised to take this after lows with Dr. Bertell A/P: He will come back  for labs and we will update vitamin D   Recommended follow up: Return in about 1 year (around 10/02/2024) for physical or sooner if needed.Schedule b4 you leave. Future Appointments  Date Time Provider Department Center  12/24/2023  9:40 AM Croitoru, Jerel, MD [facility]  10/06/2024  2:00 PM Katrinka Garnette KIDD, MD [facility]    Return precautions advised. Garnette Katrinka, MD

## 2023-10-04 ENCOUNTER — Other Ambulatory Visit: Payer: Self-pay | Admitting: Family Medicine

## 2023-10-04 DIAGNOSIS — Z131 Encounter for screening for diabetes mellitus: Secondary | ICD-10-CM | POA: Diagnosis not present

## 2023-10-04 DIAGNOSIS — E559 Vitamin D deficiency, unspecified: Secondary | ICD-10-CM | POA: Diagnosis not present

## 2023-10-04 DIAGNOSIS — R351 Nocturia: Secondary | ICD-10-CM | POA: Diagnosis not present

## 2023-10-04 DIAGNOSIS — E785 Hyperlipidemia, unspecified: Secondary | ICD-10-CM | POA: Diagnosis not present

## 2023-10-04 DIAGNOSIS — I10 Essential (primary) hypertension: Secondary | ICD-10-CM | POA: Diagnosis not present

## 2023-10-05 LAB — HEMOGLOBIN A1C
Est. average glucose Bld gHb Est-mCnc: 111 mg/dL
Hgb A1c MFr Bld: 5.5 % (ref 4.8–5.6)

## 2023-10-05 LAB — COMPREHENSIVE METABOLIC PANEL WITH GFR
ALT: 26 IU/L (ref 0–44)
AST: 24 IU/L (ref 0–40)
Albumin: 4.3 g/dL (ref 3.8–4.8)
Alkaline Phosphatase: 86 IU/L (ref 44–121)
BUN/Creatinine Ratio: 13 (ref 10–24)
BUN: 13 mg/dL (ref 8–27)
Bilirubin Total: 0.6 mg/dL (ref 0.0–1.2)
CO2: 25 mmol/L (ref 20–29)
Calcium: 9.8 mg/dL (ref 8.6–10.2)
Chloride: 102 mmol/L (ref 96–106)
Creatinine, Ser: 1 mg/dL (ref 0.76–1.27)
Globulin, Total: 2.4 g/dL (ref 1.5–4.5)
Glucose: 101 mg/dL — ABNORMAL HIGH (ref 70–99)
Potassium: 5.2 mmol/L (ref 3.5–5.2)
Sodium: 140 mmol/L (ref 134–144)
Total Protein: 6.7 g/dL (ref 6.0–8.5)
eGFR: 79 mL/min/1.73 (ref >59)

## 2023-10-05 LAB — CBC WITH DIFFERENTIAL/PLATELET
Basophils Absolute: 0.1 x10E3/uL (ref 0.0–0.2)
Basos: 1 %
EOS (ABSOLUTE): 0.9 x10E3/uL — ABNORMAL HIGH (ref 0.0–0.4)
Eos: 10 %
Hematocrit: 43.8 % (ref 37.5–51.0)
Hemoglobin: 14.8 g/dL (ref 13.0–17.7)
Immature Grans (Abs): 0 x10E3/uL (ref 0.0–0.1)
Immature Granulocytes: 0 %
Lymphocytes Absolute: 2 x10E3/uL (ref 0.7–3.1)
Lymphs: 23 %
MCH: 30.6 pg (ref 26.6–33.0)
MCHC: 33.8 g/dL (ref 31.5–35.7)
MCV: 91 fL (ref 79–97)
Monocytes Absolute: 0.7 x10E3/uL (ref 0.1–0.9)
Monocytes: 8 %
Neutrophils Absolute: 5 x10E3/uL (ref 1.4–7.0)
Neutrophils: 57 %
Platelets: 276 x10E3/uL (ref 150–450)
RBC: 4.84 x10E6/uL (ref 4.14–5.80)
RDW: 12.1 % (ref 11.6–15.4)
WBC: 8.7 x10E3/uL (ref 3.4–10.8)

## 2023-10-05 LAB — VITAMIN D 25 HYDROXY (VIT D DEFICIENCY, FRACTURES): Vit D, 25-Hydroxy: 45.3 ng/mL (ref 30.0–100.0)

## 2023-10-05 LAB — LIPID PANEL
Chol/HDL Ratio: 3.1 ratio (ref 0.0–5.0)
Cholesterol, Total: 132 mg/dL (ref 100–199)
HDL: 43 mg/dL (ref >39)
LDL Chol Calc (NIH): 72 mg/dL (ref 0–99)
Triglycerides: 86 mg/dL (ref 0–149)
VLDL Cholesterol Cal: 17 mg/dL (ref 5–40)

## 2023-10-05 LAB — PSA: Prostate Specific Ag, Serum: 1.2 ng/mL (ref 0.0–4.0)

## 2023-10-07 ENCOUNTER — Ambulatory Visit: Payer: Self-pay | Admitting: Family Medicine

## 2023-10-22 DIAGNOSIS — H905 Unspecified sensorineural hearing loss: Secondary | ICD-10-CM | POA: Diagnosis not present

## 2023-10-30 ENCOUNTER — Telehealth: Payer: Self-pay | Admitting: Family Medicine

## 2023-10-30 NOTE — Telephone Encounter (Signed)
 Received.   Copied from CRM [id number]. Topic: General - Other >> Oct 30, 2023  9:37 AM Revonda D wrote: Reason for CRM: Pt is wanting to confirm if Dr.Hunter received his medical records from his previous provider's office. Pt would like a callback with an update.

## 2023-11-07 ENCOUNTER — Other Ambulatory Visit: Payer: Self-pay

## 2023-11-08 MED ORDER — LISINOPRIL 5 MG PO TABS: 5.0000 mg | ORAL_TABLET | Freq: Every day | ORAL | 0 refills | Status: DC

## 2023-11-14 ENCOUNTER — Other Ambulatory Visit: Payer: Self-pay

## 2023-11-15 MED ORDER — SIMVASTATIN 20 MG PO TABS: 20.0000 mg | ORAL_TABLET | Freq: Every day | ORAL | 0 refills | Status: DC

## 2023-11-18 ENCOUNTER — Encounter: Payer: Self-pay | Admitting: Family Medicine

## 2023-11-18 ENCOUNTER — Telehealth: Payer: Self-pay | Admitting: *Deleted

## 2023-11-18 DIAGNOSIS — K4090 Unilateral inguinal hernia, without obstruction or gangrene, not specified as recurrent: Secondary | ICD-10-CM

## 2023-11-18 NOTE — Telephone Encounter (Signed)
 Received call from patient [phone]~ telephone.   Patient reports that he would like to proceed with robotic assisted laparoscopic right inguinal herniorrhaphy with mesh. Requested to schedule for December.   Provider aware and agreeable.

## 2023-11-20 DIAGNOSIS — H905 Unspecified sensorineural hearing loss: Secondary | ICD-10-CM | POA: Diagnosis not present

## 2023-11-27 DIAGNOSIS — Z4802 Encounter for removal of sutures: Secondary | ICD-10-CM | POA: Diagnosis not present

## 2023-12-04 NOTE — H&P (Signed)
 Christopher Obrien; [id number]; 1950/12/09   HPI Patient is a 73 year old white male who was referred to my care by Ubaldo Carnes for evaluation and treatment of a right inguinal hernia.  Patient states he was lifting something heavy and felt something pop in February of this year.  Since that time, he has noticed a lump in the right groin region when straining.  He does use a truss when he is standing or doing more strenuous activity.  He denies any episodes of incarceration. Past Medical History:  Diagnosis Date   Adopted    does not know blood familial history   CAD (coronary artery disease)    Hyperlipidemia    NSTEMI (non-ST elevated myocardial infarction) (HCC) 2005   Systemic hypertension     Past Surgical History:  Procedure Laterality Date   APPENDECTOMY  1995   CHOLECYSTECTOMY     COLONOSCOPY N/A 01/12/2013   Procedure: COLONOSCOPY;  Surgeon: Margo LITTIE Haddock, MD;  [facility];  Service: Endoscopy;  Laterality: N/A;  10:30    COLONOSCOPY N/A 07/15/2023   Procedure: COLONOSCOPY;  Surgeon: Cindie Carlin POUR, DO;  [facility];  Service: Endoscopy;  Laterality: N/A;  8:30 am, asa 3   CORONARY ANGIOPLASTY WITH STENT PLACEMENT  07/09/2003   bare metal stent to the first oblique marginal artery   NM MYOCAR PERF WALL MOTION  03/15/2008   normal    Family History  Adopted: Yes    Current Outpatient Medications on File Prior to Visit  Medication Sig Dispense Refill   aspirin 81 MG tablet Take 81 mg by mouth at bedtime.      Cholecalciferol (D3 ADULT PO) Take 1,000 Int'l Units/kg by mouth daily.     lisinopril  (ZESTRIL ) 5 MG tablet Take 1 tablet (5 mg total) by mouth daily. 90 tablet 3   Multiple Vitamins-Minerals (ONE DAILY 50 PLUS PO) Take 1 tablet by mouth daily at 6 (six) AM.     Omega-3 Fatty Acids (FISH OIL) 1200 MG CAPS Take by mouth.     Probiotic Product (PROBIOTIC DAILY PO) Take by mouth.     simvastatin  (ZOCOR ) 20 MG tablet Take 1 tablet (20 mg total) by  mouth at bedtime. 90 tablet 3   doxycycline  (VIBRAMYCIN ) 100 MG capsule Take 100 mg by mouth 2 (two) times daily.     No current facility-administered medications on file prior to visit.    Allergies  Allergen Reactions   Bee Venom Hives, Itching and Swelling    Swelling to lips, eyes, ears, and possibly throat   Penicillins Rash    Has patient had a PCN reaction causing immediate rash, facial/tongue/throat swelling, SOB or lightheadedness with hypotension: No Has patient had a PCN reaction causing severe rash involving mucus membranes or skin necrosis: No Has patient had a PCN reaction that required hospitalization: No Has patient had a PCN reaction occurring within the last 10 years: No If all of the above answers are NO, then may proceed with Cephalosporin use.     Social History   Substance and Sexual Activity  Alcohol Use No    Social History   Tobacco Use  Smoking Status Never  Smokeless Tobacco Never    Review of Systems  Constitutional: Negative.   HENT: Negative.    Eyes: Negative.   Respiratory: Negative.    Cardiovascular: Negative.   Gastrointestinal: Negative.   Genitourinary: Negative.   Musculoskeletal: Negative.   Skin: Negative.   Neurological: Negative.   Endo/Heme/Allergies:  Negative.   Psychiatric/Behavioral: Negative.      Objective   Vitals:   07/23/23 1258  BP: 116/69  Pulse: 74  Resp: 14  Temp: 98.2 F (36.8 C)    Physical Exam Vitals reviewed.  Constitutional:      Appearance: Normal appearance. He is not ill-appearing.  HENT:     Head: Normocephalic and atraumatic.   Cardiovascular:     Rate and Rhythm: Normal rate and regular rhythm.     Heart sounds: Normal heart sounds. No murmur heard.    No friction rub. No gallop.  Pulmonary:     Effort: Pulmonary effort is normal. No respiratory distress.     Breath sounds: Normal breath sounds. No stridor. No wheezing, rhonchi or rales.  Abdominal:     General: There is no  distension.     Palpations: Abdomen is soft. There is no mass.     Tenderness: There is no abdominal tenderness. There is no guarding or rebound.     Hernia: A hernia is present.     Comments: Easily reducible right inguinal hernia  Genitourinary:    Testes: Normal.   Skin:    General: Skin is warm and dry.   Neurological:     Mental Status: He is alert and oriented to person, place, and time.     Assessment  Right inguinal hernia, symptomatic Plan  Patient will call to schedule his robotic assisted laparoscopic right inguinal herniorrhaphy with mesh.  The risks and benefits of the procedure including bleeding, infection, mesh use, and the possibility of recurrence of the hernia were fully explained to the patient, who gave informed consent.

## 2023-12-24 ENCOUNTER — Ambulatory Visit: Attending: Cardiovascular Disease | Admitting: Cardiovascular Disease

## 2023-12-24 ENCOUNTER — Encounter: Payer: Self-pay | Admitting: Cardiovascular Disease

## 2023-12-24 VITALS — BP 137/80 | HR 70 | Resp 18 | Ht 68.0 in | Wt 212.0 lb

## 2023-12-24 DIAGNOSIS — E785 Hyperlipidemia, unspecified: Secondary | ICD-10-CM | POA: Diagnosis not present

## 2023-12-24 DIAGNOSIS — I2510 Atherosclerotic heart disease of native coronary artery without angina pectoris: Secondary | ICD-10-CM | POA: Diagnosis not present

## 2023-12-24 DIAGNOSIS — E669 Obesity, unspecified: Secondary | ICD-10-CM | POA: Diagnosis not present

## 2023-12-24 DIAGNOSIS — R011 Cardiac murmur, unspecified: Secondary | ICD-10-CM

## 2023-12-24 DIAGNOSIS — I10 Essential (primary) hypertension: Secondary | ICD-10-CM | POA: Diagnosis not present

## 2023-12-24 NOTE — Progress Notes (Signed)
 Cardiology office note   Date:  12/27/2023   ID:  Christopher Obrien, DOB 03/14/1950, MRN [id number]  PCP:  Christopher Garnette KIDD, MD  Cardiologist:  Jerel Balding, MD  Electrophysiologist:  None   Evaluation Performed:  Follow-Up Visit  Chief Complaint: CAD  History of Present Illness:    Christopher Obrien is a 73 y.o. male with obesity, hypertension and dyslipidemia (low HDL, increased LDL) and a remote history of non-STEMI with stent to the oblique marginal artery in 2005, but without subsequent coronary or vascular events.  Generally feels great.  He felt even better when he lost down to less than 200 pounds, but he has gained about 15 pounds back.  Feels a little more sluggish, but denies any chest pain or shortness of breath at rest or with activity.  Still going to the gym.  Has not had dizziness, syncope, palpitations, leg edema, claudication or any focal neurological complaints.  Blood pressure when he first checked it was elevated at 149/88, but when rechecked was 137/80.  Usually at home his blood pressure is in the 120s/60s range.  Typically at the gym he is doing cardio 20 minutes at 3.2-3.5 miles an hour on the treadmill and then uses weight machines.    In October 2019 on a Bruce protocol treadmill test he exercised for 11 minutes/13.4 METs without angina or ischemic ECG changes.  He has not undergone any functional testing since then.  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  Also had mitral annulus calcification.  Otherwise the echo was pretty normal  Metabolic parameters are pretty good with an LDL of 72, HDL 43, normal triglycerides and normal hemoglobin A1c.  He has normal kidney function.  Past Medical History:  Diagnosis Date   Adopted    does not know blood familial history   Allergy    CAD (coronary artery disease)    Heart murmur 2022   Hyperlipidemia    NSTEMI  (non-ST elevated myocardial infarction) (HCC) 2005   Systemic hypertension    Past Surgical History:  Procedure Laterality Date   APPENDECTOMY  1995   CHOLECYSTECTOMY     COLONOSCOPY N/A 01/12/2013   Procedure: COLONOSCOPY;  Surgeon: Margo LITTIE Haddock, MD;  Location: [facility];  Service: Endoscopy;  Laterality: N/A;  10:30    COLONOSCOPY N/A 07/15/2023   Procedure: COLONOSCOPY;  Surgeon: Cindie Carlin POUR, DO;  Location: [facility];  Service: Endoscopy;  Laterality: N/A;  8:30 am, asa 3   CORONARY ANGIOPLASTY WITH STENT PLACEMENT  07/09/2003   bare metal stent to the first oblique marginal artery   NM MYOCAR PERF WALL MOTION  03/15/2008   normal     Current Meds  Medication Sig   aspirin 81 MG tablet Take 81 mg by mouth at bedtime.    Cholecalciferol (D3 ADULT PO) Take 1,000 Units by mouth daily.   lisinopril  (ZESTRIL ) 5 MG tablet Take 1 tablet (5 mg total) by mouth daily.   Omega-3 Fatty Acids (FISH OIL) 1200 MG CAPS Take by mouth.   Probiotic Product (PROBIOTIC DAILY PO) Take by mouth.   simvastatin  (ZOCOR ) 20 MG tablet Take 1 tablet (20 mg total) by mouth at bedtime.     Allergies:   Bee venom and Penicillins   Social History   Tobacco Use   Smoking status: Never   Smokeless tobacco: Never  Vaping Use  Vaping status: Never Used  Substance Use Topics   Alcohol use: No   Drug use: No     Family Hx: The patient's family history is not on file. He was adopted.  ROS:   Please see the history of present illness.     All other systems reviewed and are negative.   Prior CV studies:   The following studies were reviewed today: Treadmill stress test October 2019  Labs/Other Tests and Data Reviewed:    Echocardiogram 2015   - Left ventricle: The cavity size was normal. There was mild focal    basal hypertrophy of the septum. Systolic function was normal.    The estimated ejection fraction was in the range of 55% to 60%.    Wall motion was normal; there were no  regional wall motion    abnormalities. Doppler parameters are consistent with abnormal    left ventricular relaxation (grade 1 diastolic dysfunction). The    E/e&' ratio is between 8-15, suggesting indeterminate LV filling    pressure.  - Aortic valve: Sclerosis without stenosis. There was mild    regurgitation.  - Mitral valve: Calcified annulus. Mildly thickened leaflets .    There was mild regurgitation.  - Left atrium: LA volume/ BSA = 26.9 ml/m2.  EKG:   EKG Interpretation Date/Time:  Tuesday December 24 2023 09:32:50 EST Ventricular Rate:  71 PR Interval:  154 QRS Duration:  96 QT Interval:  380 QTC Calculation: 412 R Axis:   -7  Text Interpretation: Normal sinus rhythm Normal ECG When compared with ECG of 17-Dec-2022 08:58, No significant change was found Confirmed by Deaisa Merida ([id number]) on 12/24/2023 9:40:40 AM        Recent Labs: 10/04/2023: ALT 26 12/25/2023: BUN 15; Creatinine, Ser 0.88; Hemoglobin 14.8; Platelets 247; Potassium 4.5; Sodium 138   Recent Lipid Panel Lab Results  Component Value Date/Time   CHOL 132 10/04/2023 10:27 AM   TRIG 86 10/04/2023 10:27 AM   HDL 43 10/04/2023 10:27 AM   CHOLHDL 3.1 10/04/2023 10:27 AM   CHOLHDL 2.5 11/21/2015 10:45 AM   LDLCALC 72 10/04/2023 10:27 AM   03/04/2019 Total cholesterol 113, HDL 45, LDL 51, triglycerides 85 Hemoglobin 15.8, creatinine 1.14, potassium 5.6, liver function tests Wt Readings from Last 3 Encounters:  12/25/23 212 lb (96.2 kg)  12/24/23 212 lb (96.2 kg)  10/03/23 213 lb 6.4 oz (96.8 kg)     Objective:    Vital Signs:  BP 137/80   Pulse 70   Resp 18   Ht 5' 8 (1.727 m)   Wt 212 lb (96.2 kg)   SpO2 94%   BMI 32.23 kg/m     General: Alert, oriented x3, no distress, mildly obese Head: no evidence of trauma, PERRL, EOMI, no exophtalmos or lid lag, no myxedema, no xanthelasma; normal ears, nose and oropharynx Neck: normal jugular venous pulsations and no hepatojugular reflux; brisk  carotid pulses without delay and no carotid bruits Chest: clear to auscultation, no signs of consolidation by percussion or palpation, normal fremitus, symmetrical and full respiratory excursions Cardiovascular: normal position and quality of the apical impulse, regular rhythm, normal first and second heart sounds, 2/6 aortic ejection murmur radiating towards the carotids no diastolic murmurs, rubs or gallops Abdomen: no tenderness or distention, no masses by palpation, no abnormal pulsatility or arterial bruits, normal bowel sounds, no hepatosplenomegaly Extremities: no clubbing, cyanosis or edema; 2+ radial, ulnar and brachial pulses bilaterally; 2+ right femoral, posterior tibial and dorsalis pedis pulses;  2+ left femoral, posterior tibial and dorsalis pedis pulses; no subclavian or femoral bruits Neurological: grossly nonfocal Psych: Normal mood and affect     ASSESSMENT & PLAN:    1. Coronary artery disease involving native coronary artery of native heart without angina pectoris   2. Dyslipidemia (high LDL; low HDL)   3. Essential hypertension   4. Mild obesity   5. Murmur       CAD: Asymptomatic despite being physically active.  Presented with coronary problems 20 years ago when he was only 73 years old, but has not had any subsequent events.  On aspirin, statin, not on beta-blockers HLP: LDL is very close to target range, he has a chronically low HDL.  Encourage physical activity. HTN: Little high when he first checked in, but usually very well-controlled.  In fact he is now only on a tiny dose of lisinopril  and if he loses more weight we may have to discontinue this as well. Obesity: He did a great job with weight loss by adjusting his diet and exercising regularly.  He has fallen off a little bit in the last few months but is eager to work hard at getting his weight under 2 pounds again. Aortic valve sclerosis: Murmur sounds a little more significant this year.  Has been 10 years  since his last echocardiogram.  Will recheck echo, since he may have developed some degree of aortic stenosis.  Patient Instructions  Medication Instructions:  No changes *If you need a refill on your cardiac medications before your next appointment, please call your pharmacy*  Lab Work: None ordered If you have labs (blood work) drawn today and your tests are completely normal, you will receive your results only by: MyChart Message (if you have MyChart) OR A paper copy in the mail If you have any lab test that is abnormal or we need to change your treatment, we will call you to review the results.  Testing/Procedures: Your physician has requested that you have an echocardiogram. Echocardiography is a painless test that uses sound waves to create images of your heart. It provides your doctor with information about the size and shape of your heart and how well your heart's chambers and valves are working. This procedure takes approximately one hour. There are no restrictions for this procedure. Please do NOT wear cologne, perfume, aftershave, or lotions (deodorant is allowed). Please arrive 15 minutes prior to your appointment time.  Please note: We ask at that you not bring children with you during ultrasound (echo/ vascular) testing. Due to room size and safety concerns, children are not allowed in the ultrasound rooms during exams. Our front office staff cannot provide observation of children in our lobby area while testing is being conducted. An adult accompanying a patient to their appointment will only be allowed in the ultrasound room at the discretion of the ultrasound technician under special circumstances. We apologize for any inconvenience.   Follow-Up: At [facility], you and your health needs are our priority.  As part of our continuing mission to provide you with exceptional heart care, our providers are all part of one team.  This team includes your primary Cardiologist  (physician) and Advanced Practice Providers or APPs (Physician Assistants and Nurse Practitioners) who all work together to provide you with the care you need, when you need it.  Your next appointment:   1 year(s)  Provider:   Jerel Balding, MD    We recommend signing up for the patient portal  called MyChart.  Sign up information is provided on this After Visit Summary.  MyChart is used to connect with patients for Virtual Visits (Telemedicine).  Patients are able to view lab/test results, encounter notes, upcoming appointments, etc.  Non-urgent messages can be sent to your provider as well.   To learn more about what you can do with MyChart, go to [email or web address].     Signed, Jerel Balding, MD  12/27/2023 10:50 AM    [facility]

## 2023-12-24 NOTE — Patient Instructions (Signed)
 Your procedure is scheduled on December 30, 2023.  Report to [facility] Main Entrance at 6:00 A.M.   Call this number if you have problems the morning of surgery:  [phone]  If you experience any cold or flu symptoms such as cough, fever, chills, shortness of breath, etc. between now and your scheduled surgery, please notify us  at the above number.   Remember:  Do not eat or drink after midnight.      Take these medicines the morning of surgery with A SIP OF WATER  none   Use inhaler if needed.     Do not wear jewelry, make-up or nail polish, including gel polish,  artificial nails, or any other type of covering on natural nails.  Do not wear lotions, powders, or perfumes, or deodorant.  Do not shave 48 hours prior to surgery.  Men may shave face and neck.  Do not bring valuables to the hospital.  [facility] is not responsible for any belongings or valuables.  Contacts, dentures or bridgework may not be worn into surgery.  Leave your suitcase in the car.  After surgery it may be brought to your room.  For patients admitted to the hospital, discharge time will be determined by your treatment team.  Patients discharged the day of surgery will not be allowed to drive home and must have someone be with them for 24 hours.   Special instructions: DO NOT SMOKE TOBACCO OR VAPE 24 HOURS PRIOR TO YOUR PROCEDURE.   Please brush your teeth morning of procedure.   Please read over the following: Anesthesia Post-op Instructions and Care and Recovery After Surgery  How to Use Chlorhexidine at Home in the Shower Chlorhexidine gluconate (CHG) is a germ-killing (antiseptic) wash that's used to clean the skin. It can get rid of the germs that normally live on the skin and can keep them away for about 24 hours. If you're having surgery, you may be told to shower with CHG at home the night before surgery. This can help lower your risk for infection. To use CHG wash in the shower,  follow the steps below. Supplies needed: CHG body wash. Clean washcloth. Clean towel. How to use CHG in the shower Follow these steps unless you're told to use CHG in a different way: Start the shower. Use your normal soap and shampoo to wash your face and hair. Turn off the shower or move out of the shower stream. Pour CHG onto a clean washcloth. Do not use any type of brush or rough sponge. Start at your neck, washing your body down to your toes. Make sure you: Wash the part of your body where the surgery will be done for at least 1 minute. Do not scrub. Do not use CHG on your head or face unless your health care provider tells you to. If it gets into your ears or eyes, rinse them well with water . Do not wash your genitals with CHG. Wash your back and under your arms. Make sure to wash skin folds. Let the CHG sit on your skin for 1-2 minutes or as long as told. Rinse your entire body in the shower, including all body creases and folds. Turn off the shower. Dry off with a clean towel. Do not put anything on your skin afterward, such as powder, lotion, or perfume. Put on clean clothes or pajamas. If it's the night before surgery, sleep in clean sheets. General tips Use CHG only as told, and follow the instructions  on the label. Use the full amount of CHG as told. This is often one bottle. Do not smoke and stay away from flames after using CHG. Your skin may feel sticky after using CHG. This is normal. The sticky feeling will go away as the CHG dries. Do not use CHG: If you have a chlorhexidine allergy or have reacted to chlorhexidine in the past. On open wounds or areas of skin that have broken skin, cuts, or scrapes. On babies younger than 3 months of age. Contact a health care provider if: You have questions about using CHG. Your skin gets irritated or itchy. You have a rash after using CHG. You swallow any CHG. Call your local poison control center [phone] in the  [place] Your eyes itch badly, or they become very red or swollen. Your hearing changes. You have trouble seeing. If you can't reach your provider, go to an urgent care or emergency room. Do not drive yourself. Get help right away if: You have swelling or tingling in your mouth or throat. You make high-pitched whistling sounds when you breathe, most often when you breathe out (wheeze). You have trouble breathing. These symptoms may be an emergency. Call 911 right away. Do not wait to see if the symptoms will go away. Do not drive yourself to the hospital. This information is not intended to replace advice given to you by your health care provider. Make sure you discuss any questions you have with your health care provider. Document Revised: 07/31/2022 Document Reviewed: 07/27/2021 Elsevier Patient Education  2024 [employer]  Laparoscopic Surgery for Groin Hernia in Adults: What to Expect  Laparoscopic surgery for groin hernia is a surgery to treat a weak spot in the groin muscles where tissue from inside your belly pushes out. Groin hernia is also called inguinal hernia.  This surgery may be planned or it may be done as an emergency surgery. During the surgery, tissue that has pushed out of the belly is moved back into place. The opening in the groin muscles is closed.  Laparoscopic surgery is done through small cuts in the belly. A scope with a light and camera (laparoscope) is used. Tell a health care provider about: Any allergies you have. All medicines you're taking. These include vitamins, herbs, eye drops, and creams. Any problems you or family members have had with anesthesia. Any bleeding problems you have. Any surgeries you've had. Any medical problems you have. Whether you're pregnant or may be pregnant. What are the risks? Your health care provider will talk with you about risks. These may include: Infection. Bleeding, blood clots, or fluid build up in the area of the  hernia. Allergy to medicines or the mesh, if a mesh was used. Damage to nearby structures in the belly. Long-term pain and swelling of the scrotum. Trouble peeing or pooping. The hernia coming back after the surgery. In some cases, your provider may need to switch from a laparoscopic surgery to one large cut in the groin (open surgery). You may need an open surgery if: You have a hernia that's hard to repair. You bleed a lot during the laparoscopic surgery. You have problems when gas is put into your belly. What happens before? When to stop eating and drinking Eat and drink only as you've been told. You may be told this: 8 hours before your surgery Stop eating most foods. Do not eat meat, fried foods, or fatty foods. Eat only light foods such as toast or crackers. All liquids are  OK except energy drinks and alcohol. 6 hours before your surgery Stop eating. Drink only clear liquids, such as water , clear fruit juice, black coffee, plain tea, and sports drinks. Do not drink energy drinks or alcohol. 2 hours before your surgery Stop drinking all liquids. You may be allowed to take medicines with small sips of water . If you do not eat and drink as told, your surgery may be delayed or canceled. Medicines Ask about changing or stopping: Any medicines you take. Any vitamins, herbs, or supplements you take. Do not take aspirin or ibuprofen unless you're told to. Surgery safety For your safety, you may: Need to wash your skin with a soap that kills germs. Get antibiotics. Have your surgery site marked. Have hair removed at the surgery site. General instructions Do not smoke, vape, or use nicotine or tobacco for at least 4 weeks before your surgery. Ask if you'll be staying overnight in the hospital. If you'll be going home right after the surgery, plan to have a responsible adult: Drive you home from the hospital or clinic. You won't be allowed to drive. Stay with you for the time  you're told. What happens during laparoscopic surgery for groin hernia? An IV will be put into a vein in your hand or arm. You may be given: A sedative to help you relax. Anesthesia to keep you from feeling pain. Three small cuts will be made in your belly. Gas will be put into your belly through one of the cuts. This will make it easier for your surgeon to see inside your belly during the surgery. A laparoscope will be put into one of the cuts in your belly. This will send pictures to a screen in the operating room. The instruments needed for the surgery will be put through the other cuts. The tissue that make up the hernia may be removed or moved back into place. The edges of the hernia may be stitched together. A piece of mesh may be used to close the hernia. Stitches or clips will be used to keep the mesh in place. The instruments and laparoscope will be removed. Your cuts will be closed with stitches, skin glue, or tape strips. A bandage may be placed over your cuts. These steps may vary. Ask what you can expect. What happens after? You'll be watched closely until you leave. This includes checking your pain level, blood pressure, heart rate, and breathing rate. You'll continue to receive fluids and medicines through an IV. Your IV will be removed when you can drink clear fluids. This information is not intended to replace advice given to you by your health care provider. Make sure you discuss any questions you have with your health care provider. Document Revised: 10/30/2022 Document Reviewed: 10/30/2022 Elsevier Patient Education  2025 [employer].   General Anesthesia, Adult, Care After The following information offers guidance on how to care for yourself after your procedure. Your health care provider may also give you more specific instructions. If you have problems or questions, contact your health care provider. What can I expect after the procedure? After the procedure, it is  common for people to: Have pain or discomfort at the IV site. Have nausea or vomiting. Have a sore throat or hoarseness. Have trouble concentrating. Feel cold or chills. Feel weak, sleepy, or tired (fatigue). Have soreness and body aches. These can affect parts of the body that were not involved in surgery. Follow these instructions at home: For the time period you were  told by your health care provider:  Rest. Do not participate in activities where you could fall or become injured. Do not drive or use machinery. Do not drink alcohol. Do not take sleeping pills or medicines that cause drowsiness. Do not make important decisions or sign legal documents. Do not take care of children on your own. General instructions Drink enough fluid to keep your urine pale yellow. If you have sleep apnea, surgery and certain medicines can increase your risk for breathing problems. Follow instructions from your health care provider about wearing your sleep device: Anytime you are sleeping, including during daytime naps. While taking prescription pain medicines, sleeping medicines, or medicines that make you drowsy. Return to your normal activities as told by your health care provider. Ask your health care provider what activities are safe for you. Take over-the-counter and prescription medicines only as told by your health care provider. Do not use any products that contain nicotine or tobacco. These products include cigarettes, chewing tobacco, and vaping devices, such as e-cigarettes. These can delay incision healing after surgery. If you need help quitting, ask your health care provider. Contact a health care provider if: You have nausea or vomiting that does not get better with medicine. You vomit every time you eat or drink. You have pain that does not get better with medicine. You cannot urinate or have bloody urine. You develop a skin rash. You have a fever. Get help right away if: You have  trouble breathing. You have chest pain. You vomit blood. These symptoms may be an emergency. Get help right away. Call 911. Do not wait to see if the symptoms will go away. Do not drive yourself to the hospital. Summary After the procedure, it is common to have a sore throat, hoarseness, nausea, vomiting, or to feel weak, sleepy, or fatigue. For the time period you were told by your health care provider, do not drive or use machinery. Get help right away if you have difficulty breathing, have chest pain, or vomit blood. These symptoms may be an emergency. This information is not intended to replace advice given to you by your health care provider. Make sure you discuss any questions you have with your health care provider. Document Revised: 04/14/2021 Document Reviewed: 04/14/2021 Elsevier Patient Education  2024 [employer].

## 2023-12-24 NOTE — Patient Instructions (Signed)
 Medication Instructions:  No changes *If you need a refill on your cardiac medications before your next appointment, please call your pharmacy*  Lab Work: None ordered If you have labs (blood work) drawn today and your tests are completely normal, you will receive your results only by: MyChart Message (if you have MyChart) OR A paper copy in the mail If you have any lab test that is abnormal or we need to change your treatment, we will call you to review the results.  Testing/Procedures: Your physician has requested that you have an echocardiogram. Echocardiography is a painless test that uses sound waves to create images of your heart. It provides your doctor with information about the size and shape of your heart and how well your heart's chambers and valves are working. This procedure takes approximately one hour. There are no restrictions for this procedure. Please do NOT wear cologne, perfume, aftershave, or lotions (deodorant is allowed). Please arrive 15 minutes prior to your appointment time.  Please note: We ask at that you not bring children with you during ultrasound (echo/ vascular) testing. Due to room size and safety concerns, children are not allowed in the ultrasound rooms during exams. Our front office staff cannot provide observation of children in our lobby area while testing is being conducted. An adult accompanying a patient to their appointment will only be allowed in the ultrasound room at the discretion of the ultrasound technician under special circumstances. We apologize for any inconvenience.   Follow-Up: At [facility], you and your health needs are our priority.  As part of our continuing mission to provide you with exceptional heart care, our providers are all part of one team.  This team includes your primary Cardiologist (physician) and Advanced Practice Providers or APPs (Physician Assistants and Nurse Practitioners) who all work together to provide you  with the care you need, when you need it.  Your next appointment:   1 year(s)  Provider:   Jerel Balding, MD    We recommend signing up for the patient portal called MyChart.  Sign up information is provided on this After Visit Summary.  MyChart is used to connect with patients for Virtual Visits (Telemedicine).  Patients are able to view lab/test results, encounter notes, upcoming appointments, etc.  Non-urgent messages can be sent to your provider as well.   To learn more about what you can do with MyChart, go to [email or web address].

## 2023-12-25 ENCOUNTER — Encounter (HOSPITAL_COMMUNITY): Payer: Self-pay

## 2023-12-25 ENCOUNTER — Other Ambulatory Visit: Payer: Self-pay

## 2023-12-25 ENCOUNTER — Encounter (HOSPITAL_COMMUNITY)
Admission: RE | Admit: 2023-12-25 | Discharge: 2023-12-25 | Disposition: A | Discharge status: Home / Self Care | Source: Ambulatory Visit | Attending: General Surgery | Admitting: General Surgery

## 2023-12-25 DIAGNOSIS — Z01818 Encounter for other preprocedural examination: Secondary | ICD-10-CM | POA: Diagnosis not present

## 2023-12-25 LAB — CBC WITH DIFFERENTIAL/PLATELET
Abs Immature Granulocytes: 0.02 K/uL (ref 0.00–0.07)
Basophils Absolute: 0.1 K/uL (ref 0.0–0.1)
Basophils Relative: 1 %
Eosinophils Absolute: 1 K/uL — ABNORMAL HIGH (ref 0.0–0.5)
Eosinophils Relative: 14 %
HCT: 42.3 % (ref 39.0–52.0)
Hemoglobin: 14.8 g/dL (ref 13.0–17.0)
Immature Granulocytes: 0 %
Lymphocytes Relative: 25 %
Lymphs Abs: 1.8 K/uL (ref 0.7–4.0)
MCH: 30.5 pg (ref 26.0–34.0)
MCHC: 35 g/dL (ref 30.0–36.0)
MCV: 87.2 fL (ref 80.0–100.0)
Monocytes Absolute: 0.6 K/uL (ref 0.1–1.0)
Monocytes Relative: 9 %
Neutro Abs: 3.8 K/uL (ref 1.7–7.7)
Neutrophils Relative %: 51 %
Platelets: 247 K/uL (ref 150–400)
RBC: 4.85 MIL/uL (ref 4.22–5.81)
RDW: 11.9 % (ref 11.5–15.5)
WBC: 7.4 K/uL (ref 4.0–10.5)
nRBC: 0 % (ref 0.0–0.2)

## 2023-12-25 LAB — BASIC METABOLIC PANEL WITH GFR
Anion gap: 6 (ref 5–15)
BUN: 15 mg/dL (ref 8–23)
CO2: 27 mmol/L (ref 22–32)
Calcium: 9.2 mg/dL (ref 8.9–10.3)
Chloride: 105 mmol/L (ref 98–111)
Creatinine, Ser: 0.88 mg/dL (ref 0.61–1.24)
GFR, Estimated: >60 mL/min (ref >60)
Glucose, Bld: 100 mg/dL — ABNORMAL HIGH (ref 70–99)
Potassium: 4.5 mmol/L (ref 3.5–5.1)
Sodium: 138 mmol/L (ref 135–145)

## 2023-12-30 ENCOUNTER — Ambulatory Visit (HOSPITAL_COMMUNITY)

## 2023-12-30 ENCOUNTER — Encounter (HOSPITAL_COMMUNITY): Payer: Self-pay | Admitting: General Surgery

## 2023-12-30 ENCOUNTER — Other Ambulatory Visit: Payer: Self-pay

## 2023-12-30 ENCOUNTER — Ambulatory Visit (HOSPITAL_COMMUNITY)
Admission: RE | Admit: 2023-12-30 | Discharge: 2023-12-30 | Disposition: A | Discharge status: Home / Self Care | Attending: General Surgery | Admitting: General Surgery

## 2023-12-30 ENCOUNTER — Encounter (HOSPITAL_COMMUNITY)
Admission: RE | Disposition: A | Discharge status: Home / Self Care | Payer: Self-pay | Source: Home / Self Care | Attending: General Surgery

## 2023-12-30 DIAGNOSIS — I2510 Atherosclerotic heart disease of native coronary artery without angina pectoris: Secondary | ICD-10-CM | POA: Insufficient documentation

## 2023-12-30 DIAGNOSIS — I10 Essential (primary) hypertension: Secondary | ICD-10-CM | POA: Diagnosis not present

## 2023-12-30 DIAGNOSIS — Z955 Presence of coronary angioplasty implant and graft: Secondary | ICD-10-CM | POA: Insufficient documentation

## 2023-12-30 DIAGNOSIS — K4090 Unilateral inguinal hernia, without obstruction or gangrene, not specified as recurrent: Secondary | ICD-10-CM

## 2023-12-30 DIAGNOSIS — I252 Old myocardial infarction: Secondary | ICD-10-CM | POA: Diagnosis not present

## 2023-12-30 DIAGNOSIS — D176 Benign lipomatous neoplasm of spermatic cord: Secondary | ICD-10-CM | POA: Insufficient documentation

## 2023-12-30 DIAGNOSIS — Z01818 Encounter for other preprocedural examination: Secondary | ICD-10-CM

## 2023-12-30 DIAGNOSIS — G8918 Other acute postprocedural pain: Secondary | ICD-10-CM | POA: Diagnosis not present

## 2023-12-30 HISTORY — PX: XI ROBOTIC ASSISTED INGUINAL HERNIA REPAIR WITH MESH: SHX6706

## 2023-12-30 SURGERY — REPAIR, HERNIA, INGUINAL, ROBOT-ASSISTED, LAPAROSCOPIC, USING MESH
Anesthesia: General | Site: Inguinal | Laterality: Right

## 2023-12-30 MED ORDER — SUGAMMADEX SODIUM 200 MG/2ML IV SOLN
INTRAVENOUS | Status: DC | PRN
  Administered 2023-12-30: 200 mg via INTRAVENOUS

## 2023-12-30 MED ORDER — ORAL CARE MOUTH RINSE: 15.0000 mL | Freq: Once | OROMUCOSAL | Status: DC

## 2023-12-30 MED ORDER — BUPIVACAINE HCL (PF) 0.5 % IJ SOLN
INTRAMUSCULAR | Status: AC
  Filled 2023-12-30: qty 30

## 2023-12-30 MED ORDER — OXYCODONE HCL 5 MG PO TABS: 5.0000 mg | ORAL_TABLET | Freq: Once | ORAL | Status: DC | PRN

## 2023-12-30 MED ORDER — ONDANSETRON HCL 4 MG/2ML IJ SOLN
INTRAMUSCULAR | Status: AC
  Filled 2023-12-30: qty 2

## 2023-12-30 MED ORDER — FENTANYL CITRATE (PF) 100 MCG/2ML IJ SOLN
INTRAMUSCULAR | Status: AC
  Filled 2023-12-30: qty 2

## 2023-12-30 MED ORDER — CHLORHEXIDINE GLUCONATE CLOTH 2 % EX PADS: 6.0000 | MEDICATED_PAD | Freq: Once | CUTANEOUS | Status: DC

## 2023-12-30 MED ORDER — OXYCODONE HCL 5 MG/5ML PO SOLN: 5.0000 mg | Freq: Once | ORAL | Status: DC | PRN

## 2023-12-30 MED ORDER — FENTANYL CITRATE (PF) 100 MCG/2ML IJ SOLN
INTRAMUSCULAR | Status: DC | PRN
  Administered 2023-12-30 (×2): 50 ug via INTRAVENOUS

## 2023-12-30 MED ORDER — BUPIVACAINE HCL (PF) 0.5 % IJ SOLN
INTRAMUSCULAR | Status: DC | PRN
  Administered 2023-12-30: 10 mL
  Administered 2023-12-30: 5 mL
  Administered 2023-12-30: 10 mL
  Administered 2023-12-30: 5 mL

## 2023-12-30 MED ORDER — STERILE WATER FOR IRRIGATION IR SOLN
Status: DC | PRN
  Administered 2023-12-30: 1000 mL

## 2023-12-30 MED ORDER — SODIUM CHLORIDE (PF) 0.9 % IJ SOLN
INTRAMUSCULAR | Status: AC
  Filled 2023-12-30: qty 30

## 2023-12-30 MED ORDER — KETOROLAC TROMETHAMINE 30 MG/ML IJ SOLN
INTRAMUSCULAR | Status: AC
  Filled 2023-12-30: qty 2

## 2023-12-30 MED ORDER — PROPOFOL 10 MG/ML IV BOLUS
INTRAVENOUS | Status: DC | PRN
  Administered 2023-12-30: 150 mg via INTRAVENOUS

## 2023-12-30 MED ORDER — CHLORHEXIDINE GLUCONATE 0.12 % MT SOLN: 15.0000 mL | Freq: Once | OROMUCOSAL | Status: DC

## 2023-12-30 MED ORDER — EPHEDRINE SULFATE (PRESSORS) 25 MG/5ML IV SOSY
PREFILLED_SYRINGE | INTRAVENOUS | Status: DC | PRN
  Administered 2023-12-30: 10 mg via INTRAVENOUS

## 2023-12-30 MED ORDER — DEXAMETHASONE SOD PHOSPHATE PF 10 MG/ML IJ SOLN
INTRAMUSCULAR | Status: DC | PRN
  Administered 2023-12-30: 4 mg via INTRAVENOUS

## 2023-12-30 MED ORDER — ROCURONIUM BROMIDE 10 MG/ML (PF) SYRINGE
PREFILLED_SYRINGE | INTRAVENOUS | Status: DC | PRN
  Administered 2023-12-30: 70 mg via INTRAVENOUS

## 2023-12-30 MED ORDER — LACTATED RINGERS IV SOLN: INTRAVENOUS | Status: DC | PRN

## 2023-12-30 MED ORDER — FENTANYL CITRATE (PF) 50 MCG/ML IJ SOSY: 25.0000 ug | PREFILLED_SYRINGE | INTRAMUSCULAR | Status: DC | PRN

## 2023-12-30 MED ORDER — LACTATED RINGERS IV SOLN: INTRAVENOUS | Status: DC

## 2023-12-30 MED ORDER — TRAMADOL HCL 50 MG PO TABS: 50.0000 mg | ORAL_TABLET | Freq: Four times a day (QID) | ORAL | 0 refills | Status: AC | PRN

## 2023-12-30 MED ORDER — EPHEDRINE 5 MG/ML INJ
INTRAVENOUS | Status: AC
  Filled 2023-12-30: qty 5

## 2023-12-30 MED ORDER — PROPOFOL 10 MG/ML IV BOLUS
INTRAVENOUS | Status: AC
  Filled 2023-12-30: qty 20

## 2023-12-30 MED ORDER — LIDOCAINE HCL (CARDIAC) PF 100 MG/5ML IV SOSY
PREFILLED_SYRINGE | INTRAVENOUS | Status: DC | PRN
  Administered 2023-12-30: 100 mg via INTRATRACHEAL

## 2023-12-30 MED ORDER — KETOROLAC TROMETHAMINE 30 MG/ML IJ SOLN
INTRAMUSCULAR | Status: DC | PRN
  Administered 2023-12-30: 30 mg via INTRAVENOUS

## 2023-12-30 MED ORDER — CHLORHEXIDINE GLUCONATE 0.12 % MT SOLN
OROMUCOSAL | Status: DC
  Filled 2023-12-30: qty 15

## 2023-12-30 MED ORDER — CEFAZOLIN SODIUM-DEXTROSE 2-4 GM/100ML-% IV SOLN
2.0000 g | INTRAVENOUS | Status: AC
  Administered 2023-12-30: 2 g via INTRAVENOUS
  Filled 2023-12-30: qty 100

## 2023-12-30 MED ORDER — CEFAZOLIN SODIUM-DEXTROSE 2-4 GM/100ML-% IV SOLN
INTRAVENOUS | Status: AC
  Filled 2023-12-30: qty 100

## 2023-12-30 MED ORDER — LIDOCAINE 2% (20 MG/ML) 5 ML SYRINGE
INTRAMUSCULAR | Status: AC
  Filled 2023-12-30: qty 5

## 2023-12-30 MED ORDER — ONDANSETRON HCL 4 MG/2ML IJ SOLN
INTRAMUSCULAR | Status: DC | PRN
  Administered 2023-12-30: 4 mg via INTRAVENOUS

## 2023-12-30 MED ORDER — ROCURONIUM BROMIDE 10 MG/ML (PF) SYRINGE
PREFILLED_SYRINGE | INTRAVENOUS | Status: AC
  Filled 2023-12-30: qty 10

## 2023-12-30 SURGICAL SUPPLY — 37 items
CHLORAPREP W/TINT 26 (MISCELLANEOUS) ×1 IMPLANT
COVER LIGHT HANDLE STERIS (MISCELLANEOUS) ×2 IMPLANT
COVER MAYO STAND XLG (MISCELLANEOUS) ×1 IMPLANT
COVER TIP SHEARS 8 DVNC (MISCELLANEOUS) ×1 IMPLANT
DERMABOND ADVANCED .7 DNX12 (GAUZE/BANDAGES/DRESSINGS) ×1 IMPLANT
DRAPE ARM DVNC X/XI (DISPOSABLE) ×3 IMPLANT
DRAPE COLUMN DVNC XI (DISPOSABLE) ×1 IMPLANT
DRIVER NDL MEGA SUTCUT DVNCXI (INSTRUMENTS) ×1 IMPLANT
ELECTRODE REM PT RTRN 9FT ADLT (ELECTROSURGICAL) ×1 IMPLANT
FORCEPS BPLR R/ABLATION 8 DVNC (INSTRUMENTS) ×1 IMPLANT
GAUZE SPONGE 4X4 12PLY STRL (GAUZE/BANDAGES/DRESSINGS) ×1 IMPLANT
GLOVE BIOGEL PI IND STRL 7.0 (GLOVE) ×4 IMPLANT
GLOVE SURG SS PI 7.5 STRL IVOR (GLOVE) ×2 IMPLANT
GOWN STRL REUS W/TWL LRG LVL3 (GOWN DISPOSABLE) ×2 IMPLANT
KIT PINK PAD W/HEAD ARM REST (MISCELLANEOUS) ×1 IMPLANT
KIT TURNOVER KIT A (KITS) ×1 IMPLANT
MANIFOLD NEPTUNE II (INSTRUMENTS) ×1 IMPLANT
MESH 3DMAX MID 5X7 RT XLRG (Mesh General) IMPLANT
NDL HYPO 21X1.5 SAFETY (NEEDLE) ×1 IMPLANT
NDL INSUFFLATION 14GA 120MM (NEEDLE) ×1 IMPLANT
OBTURATOR OPTICALSTD 8 DVNC (TROCAR) ×1 IMPLANT
PACK LAP CHOLE LZT030E (CUSTOM PROCEDURE TRAY) ×1 IMPLANT
PENCIL HANDSWITCHING (ELECTRODE) ×1 IMPLANT
POSITIONER HEAD 8X9X4 ADT (SOFTGOODS) ×1 IMPLANT
SCISSORS MNPLR CVD DVNC XI (INSTRUMENTS) ×1 IMPLANT
SEAL UNIV 5-12 XI (MISCELLANEOUS) ×3 IMPLANT
SET BASIN LINEN APH (SET/KITS/TRAYS/PACK) ×1 IMPLANT
SET TUBE DA VINCI INSUFFLATOR (TUBING) IMPLANT
SOL PREP POV-IOD 4OZ 10% (MISCELLANEOUS) ×1 IMPLANT
SUT MNCRL AB 4-0 PS2 18 (SUTURE) ×2 IMPLANT
SUT STRATA 3-0 SH (SUTURE) ×1 IMPLANT
SUT VIC AB 2-0 SH 27X BRD (SUTURE) ×1 IMPLANT
SYR 30ML LL (SYRINGE) ×1 IMPLANT
TAPE TRANSPORE STRL 2 31045 (GAUZE/BANDAGES/DRESSINGS) ×1 IMPLANT
TRAY FOL W/BAG SLVR 16FR STRL (SET/KITS/TRAYS/PACK) ×1 IMPLANT
TUBE CONNECTING 12X1/4 (SUCTIONS) ×1 IMPLANT
WATER STERILE IRR 500ML POUR (IV SOLUTION) ×1 IMPLANT

## 2023-12-30 NOTE — Op Note (Signed)
 Patient:  Christopher Obrien  DOB:  05/10/1950  MRN:  [id number]   Preop Diagnosis: Right inguinal hernia  Postop Diagnosis: Same  Procedure: Robotic assisted laparoscopic right inguinal herniorrhaphy with mesh  Surgeon: Oneil Budge, MD  Anes: General Endotracheal  Indications: Patient is a 73 year old white male who presents with a symptomatic right inguinal hernia.  The risks and benefits of the procedure including bleeding, infection, mesh use, and the possibility of recurrence of the hernia were fully explained to the patient, who gave informed consent.  Procedure note: The patient was placed in the supine position.  After induction of general endotracheal anesthesia, the perineum and abdomen were prepped and draped using the usual sterile technique with ChloraPrep and Betadine.  Surgical site confirmation was performed.  An incision was made in the left upper quadrant at Palmer's point.  A Veress needle was introduced into the abdominal cavity and confirmation of placement was done using the saline drop test.  The abdomen was then insufflated to 15 mmHg pressure.  An 8 mm trocar was introduced into the abdominal cavity under direct visualization without difficulty.  Additional 8 mm trocars were placed in the upper midline and right upper quadrant regions.  The robot was then docked and targeted.  The patient had an indirect right inguinal hernia with small bowel advancing down the canal.  A peritoneal flap was then formed lateral to medial down to Cooper's ligament.  Approximately 2 to 3 cm deep to Cooper's ligament was dissected.  This was then taken out laterally.  The patient had a lipoma of the cord which was reduced.  The hernia sac was then freed away from the spermatic cord.  Care was taken to avoid the artery, vein, and vas deferens.  I was able to dissect approximately 7 cm posterior to the hernia ring.  An extra-large Bard 3D max mesh was then inserted and secured to Cooper's ligament  using a 2-0 Vicryl suture.  An additional 2-0 Vicryl suture was placed anteriorly to the anterior abdominal wall.  The peritoneal flap was then closed using a 3-0 STRATAFIX running suture.  I then evacuated the air from the preperitoneal space and good approximation of the mesh was noted to the abdominal wall.  The robot was undocked and all air was evacuated from the abdominal cavity prior to the removal of the trocars.  All wounds were irrigated with normal saline.  All wounds were closed with 4-0 Monocryl subcuticular sutures.  Dermabond was applied.  A Tap block was then performed by anesthesia.  All tape and needle counts were correct at the end of the procedure.  The patient was extubated in the operating room and transferred to PACU in stable condition.  Complications: None  EBL: Minimal  Specimen: None

## 2023-12-30 NOTE — Interval H&P Note (Signed)
 History and Physical Interval Note:  12/30/2023 7:07 AM  Christopher Obrien  has presented today for surgery, with the diagnosis of INGUINAL HERNIA, RIGHT.  The various methods of treatment have been discussed with the patient and family. After consideration of risks, benefits and other options for treatment, the patient has consented to  Procedure(s): REPAIR, HERNIA, INGUINAL, ROBOT-ASSISTED, LAPAROSCOPIC, USING MESH (Right) as a surgical intervention.  The patient's history has been reviewed, patient examined, no change in status, stable for surgery.  I have reviewed the patient's chart and labs.  Questions were answered to the patient's satisfaction.     Oneil Budge

## 2023-12-30 NOTE — Transfer of Care (Signed)
 Immediate Anesthesia Transfer of Care Note  Patient: Christopher Obrien  Procedure(s) Performed: REPAIR, HERNIA, INGUINAL, ROBOT-ASSISTED, LAPAROSCOPIC, USING MESH (Right: Inguinal)  Patient Location: PACU  Anesthesia Type:General  Level of Consciousness: awake, alert , oriented, and patient cooperative  Airway & Oxygen Therapy: Patient Spontanous Breathing and Patient connected to nasal cannula oxygen  Post-op Assessment: Report given to RN and Post -op Vital signs reviewed and stable  Post vital signs: Reviewed and stable  Last Vitals:  Vitals Value Taken Time  BP 133/77 12/30/23 09:38  Temp    Pulse 69 12/30/23 09:44  Resp 12 12/30/23 09:44  SpO2 99 % 12/30/23 09:44  Vitals shown include unfiled device data.  Last Pain:  Vitals:   12/30/23 0645  PainSc: 0-No pain         Complications: No notable events documented.

## 2023-12-30 NOTE — Anesthesia Procedure Notes (Signed)
 Anesthesia Regional Block: TAP block   Pre-Anesthetic Checklist: , timeout performed,  Correct Patient, Correct Site, Correct Laterality,  Correct Procedure, Correct Position, site marked,  Risks and benefits discussed,  Surgical consent,  Pre-op evaluation,  At surgeon's request and post-op pain management  Laterality: Left and Right  Prep: chloraprep       Needles:  Injection technique: Single-shot  Needle Type: Stimiplex     Needle Length: 15cm  Needle Gauge: 20     Additional Needles:   Procedures:,,,, ultrasound used (permanent image in chart),,    Narrative:  Start time: 12/30/2023 9:16 AM End time: 12/30/2023 9:23 AM Injection made incrementally with aspirations every 5 mL.  Performed by: With CRNAs  CRNA: Pheobe Adine CROME, CRNA  Additional Notes: With surgeon

## 2023-12-30 NOTE — Anesthesia Postprocedure Evaluation (Signed)
 Anesthesia Post Note  Patient: Christopher Obrien  Procedure(s) Performed: REPAIR, HERNIA, INGUINAL, ROBOT-ASSISTED, LAPAROSCOPIC, USING MESH (Right: Inguinal)  Patient location during evaluation: PACU Anesthesia Type: General Level of consciousness: awake and alert Pain management: pain level controlled Vital Signs Assessment: post-procedure vital signs reviewed and stable Respiratory status: spontaneous breathing, nonlabored ventilation, respiratory function stable and patient connected to nasal cannula oxygen Cardiovascular status: blood pressure returned to baseline and stable Postop Assessment: no apparent nausea or vomiting Anesthetic complications: no   No notable events documented.   Last Vitals:  Vitals:   12/30/23 0938 12/30/23 0945  BP: 133/77 127/80  Pulse: 77 69  Resp: 18 13  Temp: 36.5 C   SpO2: 99% 99%    Last Pain:  Vitals:   12/30/23 0645  PainSc: 0-No pain                 Andrea Limes

## 2023-12-30 NOTE — Anesthesia Procedure Notes (Signed)
 Procedure Name: Intubation Date/Time: 12/30/2023 7:37 AM  Performed by: Pheobe Adine CROME, CRNAPre-anesthesia Checklist: Patient identified, Emergency Drugs available, Suction available, Patient being monitored and Timeout performed Patient Re-evaluated:Patient Re-evaluated prior to induction Oxygen Delivery Method: Circle system utilized Preoxygenation: Pre-oxygenation with 100% oxygen Induction Type: IV induction Ventilation: Mask ventilation without difficulty Laryngoscope Size: Mac and 3 Grade View: Grade II Tube type: Oral Tube size: 7.5 mm Number of attempts: 2 Airway Equipment and Method: Stylet and Video-laryngoscopy Placement Confirmation: ETT inserted through vocal cords under direct vision, positive ETCO2, CO2 detector and breath sounds checked- equal and bilateral Secured at: 23 cm Tube secured with: Tape Dental Injury: Teeth and Oropharynx as per pre-operative assessment  Comments: Dlx1, poor view, glide x1

## 2023-12-30 NOTE — Anesthesia Preprocedure Evaluation (Signed)
 Anesthesia Evaluation  Patient identified by MRN, date of birth, ID band Patient awake    Reviewed: Allergy & Precautions, H&P , NPO status , Patient's Chart, lab work & pertinent test results  Airway Mallampati: II  TM Distance: >3 FB Neck ROM: Full    Dental no notable dental hx.    Pulmonary neg pulmonary ROS   Pulmonary exam normal breath sounds clear to auscultation       Cardiovascular hypertension, + CAD and + Past MI  Normal cardiovascular exam+ Valvular Problems/Murmurs  Rhythm:Regular Rate:Normal  MI/stent 2005 Ef 2015 60%   Neuro/Psych negative neurological ROS  negative psych ROS   GI/Hepatic negative GI ROS, Neg liver ROS,,,  Endo/Other  negative endocrine ROS    Renal/GU negative Renal ROS  negative genitourinary   Musculoskeletal negative musculoskeletal ROS (+)    Abdominal   Peds negative pediatric ROS (+)  Hematology negative hematology ROS (+)   Anesthesia Other Findings   Reproductive/Obstetrics negative OB ROS                              Anesthesia Physical Anesthesia Plan  ASA: 3  Anesthesia Plan: General   Post-op Pain Management:    Induction: Intravenous  PONV Risk Score and Plan:   Airway Management Planned: Oral ETT  Additional Equipment:   Intra-op Plan:   Post-operative Plan: Extubation in OR  Informed Consent: I have reviewed the patients History and Physical, chart, labs and discussed the procedure including the risks, benefits and alternatives for the proposed anesthesia with the patient or authorized representative who has indicated his/her understanding and acceptance.     Dental advisory given  Plan Discussed with: CRNA  Anesthesia Plan Comments:         Anesthesia Quick Evaluation

## 2023-12-31 ENCOUNTER — Encounter (HOSPITAL_COMMUNITY): Payer: Self-pay | Admitting: General Surgery

## 2024-01-01 ENCOUNTER — Encounter: Payer: Self-pay | Admitting: Cardiovascular Disease

## 2024-01-01 MED ORDER — SIMVASTATIN 20 MG PO TABS: 20.0000 mg | ORAL_TABLET | Freq: Every day | ORAL | 3 refills | Status: AC

## 2024-01-01 MED ORDER — LISINOPRIL 5 MG PO TABS: 5.0000 mg | ORAL_TABLET | Freq: Every day | ORAL | 3 refills | Status: AC

## 2024-01-09 ENCOUNTER — Encounter: Payer: Self-pay | Admitting: General Surgery

## 2024-01-09 ENCOUNTER — Ambulatory Visit (INDEPENDENT_AMBULATORY_CARE_PROVIDER_SITE_OTHER): Admitting: General Surgery

## 2024-01-09 DIAGNOSIS — K4090 Unilateral inguinal hernia, without obstruction or gangrene, not specified as recurrent: Secondary | ICD-10-CM

## 2024-01-09 DIAGNOSIS — Z09 Encounter for follow-up examination after completed treatment for conditions other than malignant neoplasm: Secondary | ICD-10-CM

## 2024-01-09 NOTE — Progress Notes (Signed)
 Subjective:     Christopher Obrien  Virtual telephone postoperative visit from performed with patient.  He is status post a robotic assisted laparoscopic right inguinal herniorrhaphy with mesh.  He states he is doing well.  He still has a little soreness in the right groin region, but no swelling is noted. Objective:    There were no vitals taken for this visit.  General:  alert and cooperative       Assessment:    Doing well postoperatively.    Plan:   I told him he could increase his activity as able.  Should he have any questions or problems, he was instructed to return to my care for evaluation.  Follow-up here as needed.  As this was a part of the total global surgical fee, this was not a billable visit.  Total telephone time was 3 minutes.

## 2024-02-05 ENCOUNTER — Ambulatory Visit: Payer: Self-pay | Admitting: Cardiovascular Disease

## 2024-02-05 ENCOUNTER — Ambulatory Visit (HOSPITAL_COMMUNITY)
Admission: RE | Admit: 2024-02-05 | Discharge: 2024-02-05 | Disposition: A | Discharge status: Home / Self Care | Source: Ambulatory Visit | Attending: Internal Medicine | Admitting: Internal Medicine

## 2024-02-05 DIAGNOSIS — I350 Nonrheumatic aortic (valve) stenosis: Secondary | ICD-10-CM

## 2024-02-05 DIAGNOSIS — R011 Cardiac murmur, unspecified: Secondary | ICD-10-CM | POA: Diagnosis present

## 2024-02-05 LAB — ECHOCARDIOGRAM COMPLETE
AR max vel: 1.6 cm2
AV Area VTI: 1.42 cm2
AV Area mean vel: 1.41 cm2
AV Mean grad: 17.7 mmHg
AV Peak grad: 30.9 mmHg
Ao pk vel: 2.78 m/s
Area-P 1/2: 3.31 cm2
S' Lateral: 3.05 cm

## 2024-03-11 ENCOUNTER — Ambulatory Visit: Admitting: Gastroenterology

## 2024-10-05 ENCOUNTER — Encounter: Admitting: Family Medicine

## 2024-10-06 ENCOUNTER — Encounter: Admitting: Family Medicine
